# Patient Record
Sex: Male | Born: 1975 | Race: White | Hispanic: No | Marital: Married | Smoking: Never smoker
Health system: Southern US, Community
[De-identification: ages and names within clinical notes are randomized; demographics above are authoritative.]

## PROBLEM LIST (undated history)

## (undated) DIAGNOSIS — D179 Benign lipomatous neoplasm, unspecified: Secondary | ICD-10-CM

## (undated) DIAGNOSIS — E559 Vitamin D deficiency, unspecified: Secondary | ICD-10-CM

## (undated) DIAGNOSIS — I10 Essential (primary) hypertension: Secondary | ICD-10-CM

## (undated) HISTORY — DX: Essential (primary) hypertension: I10

## (undated) HISTORY — DX: Vitamin D deficiency, unspecified: E55.9

---

## 2014-12-30 ENCOUNTER — Ambulatory Visit
Admission: EM | Admit: 2014-12-30 | Discharge: 2014-12-30 | Disposition: A | Discharge status: Home / Self Care | Payer: Commercial Managed Care - HMO | Attending: Family Medicine | Admitting: Family Medicine

## 2014-12-30 DIAGNOSIS — R51 Headache: Secondary | ICD-10-CM | POA: Diagnosis not present

## 2014-12-30 DIAGNOSIS — J069 Acute upper respiratory infection, unspecified: Secondary | ICD-10-CM | POA: Diagnosis not present

## 2014-12-30 DIAGNOSIS — R030 Elevated blood-pressure reading, without diagnosis of hypertension: Secondary | ICD-10-CM | POA: Diagnosis not present

## 2014-12-30 DIAGNOSIS — E669 Obesity, unspecified: Secondary | ICD-10-CM | POA: Diagnosis not present

## 2014-12-30 DIAGNOSIS — IMO0001 Reserved for inherently not codable concepts without codable children: Secondary | ICD-10-CM

## 2014-12-30 DIAGNOSIS — R519 Headache, unspecified: Secondary | ICD-10-CM

## 2014-12-30 LAB — BASIC METABOLIC PANEL
ANION GAP: 8 (ref 5–15)
BUN: 12 mg/dL (ref 6–20)
CALCIUM: 9.3 mg/dL (ref 8.9–10.3)
CO2: 30 mmol/L (ref 22–32)
Chloride: 96 mmol/L — ABNORMAL LOW (ref 101–111)
Creatinine, Ser: 1.15 mg/dL (ref 0.61–1.24)
GFR calc Af Amer: >60 mL/min (ref >60)
GLUCOSE: 90 mg/dL (ref 65–99)
POTASSIUM: 4.7 mmol/L (ref 3.5–5.1)
SODIUM: 134 mmol/L — AB (ref 135–145)

## 2014-12-30 LAB — GLUCOSE, CAPILLARY: GLUCOSE-CAPILLARY: 100 mg/dL — AB (ref 65–99)

## 2014-12-30 MED ORDER — LISINOPRIL 20 MG PO TABS: 10.0000 mg | ORAL_TABLET | Freq: Every day | ORAL | Status: DC

## 2014-12-30 MED ORDER — CLONIDINE HCL 0.1 MG PO TABS
0.1000 mg | ORAL_TABLET | Freq: Once | ORAL | Status: AC
  Administered 2014-12-30: 0.1 mg via ORAL

## 2014-12-30 NOTE — Discharge Instructions (Signed)
Obesity Obesity is defined as having too much total body fat and a body mass index (BMI) of 30 or more. BMI is an estimate of body fat and is calculated from your height and weight. Obesity happens when you consume more calories than you can burn by exercising or performing daily physical tasks. Prolonged obesity can cause major illnesses or emergencies, such as:   Stroke.  Heart disease.  Diabetes.  Cancer.  Arthritis.  High blood pressure (hypertension).  High cholesterol.  Sleep apnea.  Erectile dysfunction.  Infertility problems. CAUSES   Regularly eating unhealthy foods.  Physical inactivity.  Certain disorders, such as an underactive thyroid (hypothyroidism), Cushing's syndrome, and polycystic ovarian syndrome.  Certain medicines, such as steroids, some depression medicines, and antipsychotics.  Genetics.  Lack of sleep. DIAGNOSIS  A health care provider can diagnose obesity after calculating your BMI. Obesity will be diagnosed if your BMI is 30 or higher.  There are other methods of measuring obesity levels. Some other methods include measuring your skinfold thickness, your waist circumference, and comparing your hip circumference to your waist circumference. TREATMENT  A healthy treatment program includes some or all of the following:  Long-term dietary changes.  Exercise and physical activity.  Behavioral and lifestyle changes.  Medicine only under the supervision of your health care provider. Medicines may help, but only if they are used with diet and exercise programs. An unhealthy treatment program includes:  Fasting.  Fad diets.  Supplements and drugs. These choices do not succeed in long-term weight control.  HOME CARE INSTRUCTIONS   Exercise and perform physical activity as directed by your health care provider. To increase physical activity, try the following:  Use stairs instead of elevators.  Park farther away from store  entrances.  Garden, bike, or walk instead of watching television or using the computer.  Eat healthy, low-calorie foods and drinks on a regular basis. Eat more fruits and vegetables. Use low-calorie cookbooks or take healthy cooking classes.  Limit fast food, sweets, and processed snack foods.  Eat smaller portions.  Keep a daily journal of everything you eat. There are many free websites to help you with this. It may be helpful to measure your foods so you can determine if you are eating the correct portion sizes.  Avoid drinking alcohol. Drink more water and drinks without calories.  Take vitamins and supplements only as recommended by your health care provider.  Weight-loss support groups, Tax adviser, counselors, and stress reduction education can also be very helpful. SEEK IMMEDIATE MEDICAL CARE IF:  You have chest pain or tightness.  You have trouble breathing or feel short of breath.  You have weakness or leg numbness.  You feel confused or have trouble talking.  You have sudden changes in your vision. MAKE SURE YOU:  Understand these instructions.  Will watch your condition.  Will get help right away if you are not doing well or get worse. Document Released: 06/03/2004 Document Revised: 09/10/2013 Document Reviewed: 06/02/2011 [facility] Patient Information 2015 [place], [place]. This information is not intended to replace advice given to you by your health care provider. Make sure you discuss any questions you have with your health care provider. Otitis Media With Effusion Otitis media with effusion is the presence of fluid in the middle ear. This is a common problem in children, which often follows ear infections. It may be present for weeks or longer after the infection. Unlike an acute ear infection, otitis media with effusion refers only to fluid  behind the ear drum and not infection. Children with repeated ear and sinus infections and allergy problems are  the most likely to get otitis media with effusion. CAUSES  The most frequent cause of the fluid buildup is dysfunction of the eustachian tubes. These are the tubes that drain fluid in the ears to the back of the nose (nasopharynx). SYMPTOMS   The main symptom of this condition is hearing loss. As a result, you or your child may:  Listen to the TV at a loud volume.  Not respond to questions.  Ask "what" often when spoken to.  Mistake or confuse one sound or word for another.  There may be a sensation of fullness or pressure but usually not pain. DIAGNOSIS   Your health care provider will diagnose this condition by examining you or your child's ears.  Your health care provider may test the pressure in you or your child's ear with a tympanometer.  A hearing test may be conducted if the problem persists. TREATMENT   Treatment depends on the duration and the effects of the effusion.  Antibiotics, decongestants, nose drops, and cortisone-type drugs (tablets or nasal spray) may not be helpful.  Children with persistent ear effusions may have delayed language or behavioral problems. Children at risk for developmental delays in hearing, learning, and speech may require referral to a specialist earlier than children not at risk.  You or your child's health care provider may suggest a referral to an ear, nose, and throat surgeon for treatment. The following may help restore normal hearing:  Drainage of fluid.  Placement of ear tubes (tympanostomy tubes).  Removal of adenoids (adenoidectomy). HOME CARE INSTRUCTIONS   Avoid secondhand smoke.  Infants who are breastfed are less likely to have this condition.  Avoid feeding infants while they are lying flat.  Avoid known environmental allergens.  Avoid people who are sick. SEEK MEDICAL CARE IF:   Hearing is not better in 3 months.  Hearing is worse.  Ear pain.  Drainage from the ear.  Dizziness. MAKE SURE YOU:    Understand these instructions.  Will watch your condition.  Will get help right away if you are not doing well or get worse. Document Released: 06/03/2004 Document Revised: 09/10/2013 Document Reviewed: 11/21/2012 [facility] Patient Information 2015 [place], [place]. This information is not intended to replace advice given to you by your health care provider. Make sure you discuss any questions you have with your health care provider. DASH Eating Plan DASH stands for "Dietary Approaches to Stop Hypertension." The DASH eating plan is a healthy eating plan that has been shown to reduce high blood pressure (hypertension). Additional health benefits may include reducing the risk of type 2 diabetes mellitus, heart disease, and stroke. The DASH eating plan may also help with weight loss. WHAT DO I NEED TO KNOW ABOUT THE DASH EATING PLAN? For the DASH eating plan, you will follow these general guidelines:  Choose foods with a percent daily value for sodium of less than 5% (as listed on the food label).  Use salt-free seasonings or herbs instead of table salt or sea salt.  Check with your health care provider or pharmacist before using salt substitutes.  Eat lower-sodium products, often labeled as "lower sodium" or "no salt added."  Eat fresh foods.  Eat more vegetables, fruits, and low-fat dairy products.  Choose whole grains. Look for the word "whole" as the first word in the ingredient list.  Choose fish and skinless chicken or [place] more  often than red meat. Limit fish, poultry, and meat to 6 oz (170 g) each day.  Limit sweets, desserts, sugars, and sugary drinks.  Choose heart-healthy fats.  Limit cheese to 1 oz (28 g) per day.  Eat more home-cooked food and less restaurant, buffet, and fast food.  Limit fried foods.  Cook foods using methods other than frying.  Limit canned vegetables. If you do use them, rinse them well to decrease the sodium.  When eating at a restaurant,  ask that your food be prepared with less salt, or no salt if possible. WHAT FOODS CAN I EAT? Seek help from a dietitian for individual calorie needs. Grains Whole grain or whole wheat bread. Brown rice. Whole grain or whole wheat pasta. Quinoa, bulgur, and whole grain cereals. Low-sodium cereals. Corn or whole wheat flour tortillas. Whole grain cornbread. Whole grain crackers. Low-sodium crackers. Vegetables Fresh or frozen vegetables (raw, steamed, roasted, or grilled). Low-sodium or reduced-sodium tomato and vegetable juices. Low-sodium or reduced-sodium tomato sauce and paste. Low-sodium or reduced-sodium canned vegetables.  Fruits All fresh, canned (in natural juice), or frozen fruits. Meat and Other Protein Products Ground beef (85% or leaner), grass-fed beef, or beef trimmed of fat. Skinless chicken or [place]. Ground chicken or [place]. Pork trimmed of fat. All fish and seafood. Eggs. Dried beans, peas, or lentils. Unsalted nuts and seeds. Unsalted canned beans. Dairy Low-fat dairy products, such as skim or 1% milk, 2% or reduced-fat cheeses, low-fat ricotta or cottage cheese, or plain low-fat yogurt. Low-sodium or reduced-sodium cheeses. Fats and Oils Tub margarines without trans fats. Light or reduced-fat mayonnaise and salad dressings (reduced sodium). Avocado. Safflower, olive, or canola oils. Natural peanut or almond butter. Other Unsalted popcorn and pretzels. The items listed above may not be a complete list of recommended foods or beverages. Contact your dietitian for more options. WHAT FOODS ARE NOT RECOMMENDED? Grains White bread. White pasta. White rice. Refined cornbread. Bagels and croissants. Crackers that contain trans fat. Vegetables Creamed or fried vegetables. Vegetables in a cheese sauce. Regular canned vegetables. Regular canned tomato sauce and paste. Regular tomato and vegetable juices. Fruits Dried fruits. Canned fruit in light or heavy syrup. Fruit juice. Meat  and Other Protein Products Fatty cuts of meat. Ribs, chicken wings, bacon, sausage, bologna, salami, chitterlings, fatback, hot dogs, bratwurst, and packaged luncheon meats. Salted nuts and seeds. Canned beans with salt. Dairy Whole or 2% milk, cream, half-and-half, and cream cheese. Whole-fat or sweetened yogurt. Full-fat cheeses or blue cheese. Nondairy creamers and whipped toppings. Processed cheese, cheese spreads, or cheese curds. Condiments Onion and garlic salt, seasoned salt, table salt, and sea salt. Canned and packaged gravies. Worcestershire sauce. Tartar sauce. Barbecue sauce. Teriyaki sauce. Soy sauce, including reduced sodium. Steak sauce. Fish sauce. Oyster sauce. Cocktail sauce. Horseradish. Ketchup and mustard. Meat flavorings and tenderizers. Bouillon cubes. Hot sauce. Tabasco sauce. Marinades. Taco seasonings. Relishes. Fats and Oils Butter, stick margarine, lard, shortening, ghee, and bacon fat. Coconut, palm kernel, or palm oils. Regular salad dressings. Other Pickles and olives. Salted popcorn and pretzels. The items listed above may not be a complete list of foods and beverages to avoid. Contact your dietitian for more information. WHERE CAN I FIND MORE INFORMATION? National Heart, Lung, and [employer] Institute: travelstabloid.com Document Released: 04/15/2011 Document Revised: 09/10/2013 Document Reviewed: 02/28/2013 [facility] Patient Information 2015 [place], [place]. This information is not intended to replace advice given to you by your health care provider. Make sure you discuss any questions you have with your  health care provider. Hypertension Hypertension, commonly called high blood pressure, is when the force of blood pumping through your arteries is too strong. Your arteries are the blood vessels that carry blood from your heart throughout your body. A blood pressure reading consists of a higher number over a lower number, such as 110/72.  The higher number (systolic) is the pressure inside your arteries when your heart pumps. The lower number (diastolic) is the pressure inside your arteries when your heart relaxes. Ideally you want your blood pressure below 120/80. Hypertension forces your heart to work harder to pump blood. Your arteries may become narrow or stiff. Having hypertension puts you at risk for heart disease, stroke, and other problems.  RISK FACTORS Some risk factors for high blood pressure are controllable. Others are not.  Risk factors you cannot control include:   Race. You may be at higher risk if you are African American.  Age. Risk increases with age.  Gender. Men are at higher risk than women before age 41 years. After age 35, women are at higher risk than men. Risk factors you can control include:  Not getting enough exercise or physical activity.  Being overweight.  Getting too much fat, sugar, calories, or salt in your diet.  Drinking too much alcohol. SIGNS AND SYMPTOMS Hypertension does not usually cause signs or symptoms. Extremely high blood pressure (hypertensive crisis) may cause headache, anxiety, shortness of breath, and nosebleed. DIAGNOSIS  To check if you have hypertension, your health care provider will measure your blood pressure while you are seated, with your arm held at the level of your heart. It should be measured at least twice using the same arm. Certain conditions can cause a difference in blood pressure between your right and left arms. A blood pressure reading that is higher than normal on one occasion does not mean that you need treatment. If one blood pressure reading is high, ask your health care provider about having it checked again. TREATMENT  Treating high blood pressure includes making lifestyle changes and possibly taking medicine. Living a healthy lifestyle can help lower high blood pressure. You may need to change some of your habits. Lifestyle changes may  include:  Following the DASH diet. This diet is high in fruits, vegetables, and whole grains. It is low in salt, red meat, and added sugars.  Getting at least 2 hours of brisk physical activity every week.  Losing weight if necessary.  Not smoking.  Limiting alcoholic beverages.  Learning ways to reduce stress. If lifestyle changes are not enough to get your blood pressure under control, your health care provider may prescribe medicine. You may need to take more than one. Work closely with your health care provider to understand the risks and benefits. HOME CARE INSTRUCTIONS  Have your blood pressure rechecked as directed by your health care provider.   Take medicines only as directed by your health care provider. Follow the directions carefully. Blood pressure medicines must be taken as prescribed. The medicine does not work as well when you skip doses. Skipping doses also puts you at risk for problems.   Do not smoke.   Monitor your blood pressure at home as directed by your health care provider. SEEK MEDICAL CARE IF:   You think you are having a reaction to medicines taken.  You have recurrent headaches or feel dizzy.  You have swelling in your ankles.  You have trouble with your vision. SEEK IMMEDIATE MEDICAL CARE IF:  You develop a  severe headache or confusion.  You have unusual weakness, numbness, or feel faint.  You have severe chest or abdominal pain.  You vomit repeatedly.  You have trouble breathing. MAKE SURE YOU:   Understand these instructions.  Will watch your condition.  Will get help right away if you are not doing well or get worse. Document Released: 04/26/2005 Document Revised: 09/10/2013 Document Reviewed: 02/16/2013 [facility] Patient Information 2015 [place], [place]. This information is not intended to replace advice given to you by your health care provider. Make sure you discuss any questions you have with your health care provider.

## 2014-12-30 NOTE — ED Notes (Signed)
C/o headache x 1 week. Back of head and occasionally radiates to front right temple. Denies blurred vision. "when I close my eyes sometimes it feels better"

## 2014-12-30 NOTE — ED Provider Notes (Signed)
CSN: [id number]     Arrival date & time 12/30/14  0300 History   First MD Initiated Contact with Patient 12/30/14 [phone]     Chief Complaint  Patient presents with  . 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  Does not have PCM this is his first visit in 6 years. Saw optometry this year new Rx for contacts and glasses one eye nearsighted, the other farsighted worsened 0.5 each patient. Denied visual changes or loss since headache began.  Has been gaining weight typically 170 lbs now 230lbs lost 8 lbs in past 2 months working with a trainer exercising but not doing well with his diet.  Eating the same thing as his teenage kids.  Added brocolli and spinach last week and had diarrhea daily increased gas.  Has been awakening 0400 with headache the past week taking motrin 800mg  po then falling back asleep until usual wake up 0530 for work.  Typically goes to bed 22-2300 nightly  Has been taking 2 excedrin tabs po at bedtime.  Denied recent illness or exposure to sick family members/coworkers.   Patient unsure of family history has never known father or extended relatives and mother died MVA age 15, maternal uncle with hypertension Denied seasonal allergies, hypertension, diabetes, vision problems.  Patient is a 39 y.o. male presenting with headaches. The history is provided by the patient.  Headache Pain location:  Occipital and frontal Quality:  Sharp Radiates to:  Does not radiate Severity currently:  5/10 Onset quality:  Sudden Duration:  1 week Timing:  Constant Progression:  Waxing and waning Chronicity:  New Similar to prior headaches: no   Context: activity   Context: not exposure to bright light, not caffeine, not coughing, not defecating, not eating, not stress, not exposure to cold air, not  intercourse, not loud noise and not straining   Relieved by:  NSAIDs and aspirin Worsened by:  Activity Ineffective treatments:  Aspirin and NSAIDs Associated symptoms: diarrhea   Associated symptoms: no abdominal pain, no back pain, no blurred vision, no congestion, no cough, no dizziness, no drainage, no ear pain, no eye pain, no facial pain, no fatigue, no fever, no focal weakness, no hearing loss, no loss of balance, no myalgias, no nausea, no near-syncope, no neck pain, no neck stiffness, no numbness, no paresthesias, no photophobia, no seizures, no sinus pressure, no sore throat, no swollen glands, no syncope, no tingling, no URI, no visual change, no vomiting and no weakness   Diarrhea:    Number of occurrences:  1-2 times per day   Severity:  Mild   Duration:  1 week   Timing:  Intermittent   Progression:  Unchanged Risk factors: sedentary lifestyle   Risk factors: no anger, no family hx of SAH and does not have insomnia     History reviewed. No pertinent past medical history. History reviewed. No pertinent past surgical history. History reviewed. No pertinent family history. Social History  Substance Use Topics  . Smoking status: Never Smoker   . Smokeless tobacco: None  . Alcohol Use: No    Review of Systems  Constitutional: Negative for fever, chills, diaphoresis, activity change, appetite change and fatigue.  HENT: Negative for congestion, dental problem, drooling, ear discharge, ear pain, facial swelling, hearing loss, mouth sores, nosebleeds, postnasal drip, rhinorrhea, sinus pressure, sneezing, sore throat, tinnitus, trouble swallowing and voice  change.   Eyes: Negative for blurred vision, photophobia, pain, discharge, redness, itching and visual disturbance.  Respiratory: Negative for cough, choking, chest tightness, shortness of breath, wheezing and stridor.   Cardiovascular: Negative for chest pain, palpitations, leg swelling, syncope and near-syncope.   Gastrointestinal: Positive for diarrhea. Negative for nausea, vomiting, abdominal pain, constipation, blood in stool, abdominal distention, anal bleeding and rectal pain.  Endocrine: Negative for cold intolerance and heat intolerance.  Genitourinary: Negative for dysuria, frequency and hematuria.  Musculoskeletal: Negative for myalgias, back pain, joint swelling, arthralgias, gait problem, neck pain and neck stiffness.  Skin: Negative for color change, pallor, rash and wound.  Allergic/Immunologic: Negative for environmental allergies, food allergies and immunocompromised state.  Neurological: Positive for headaches. Negative for dizziness, tremors, focal weakness, seizures, syncope, facial asymmetry, speech difficulty, weakness, light-headedness, numbness, paresthesias and loss of balance.  Hematological: Negative for adenopathy. Does not bruise/bleed easily.  Psychiatric/Behavioral: Positive for sleep disturbance. Negative for behavioral problems, confusion and agitation.    Allergies  Review of patient's allergies indicates no known allergies.  Home Medications   Prior to Admission medications   Not on File   BP 158/105 mmHg  Pulse 73  Temp(Src) 98 F (36.7 C) (Oral)  Resp 17  Ht 5\' 8"  (1.727 m)  Wt 238 lb (107.956 kg)  BMI 36.20 kg/m2  SpO2 100% Physical Exam  Constitutional: He is oriented to person, place, and time. Vital signs are normal. He appears well-developed and well-nourished. No distress.  HENT:  Head: Normocephalic and atraumatic.  Right Ear: Hearing, external ear and ear canal normal. Tympanic membrane is scarred. A middle ear effusion is present.  Left Ear: Hearing, external ear and ear canal normal. Tympanic membrane is scarred. A middle ear effusion is present.  Nose: Nose normal. No mucosal edema, rhinorrhea or sinus tenderness. Right sinus exhibits no maxillary sinus tenderness and no frontal sinus tenderness. Left sinus exhibits no maxillary sinus tenderness  and no frontal sinus tenderness.  Mouth/Throat: Uvula is midline, oropharynx is clear and moist and mucous membranes are normal. He does not have dentures. No oral lesions. No trismus in the jaw. Normal dentition. No dental abscesses, uvula swelling, lacerations or dental caries. No oropharyngeal exudate, posterior oropharyngeal edema, posterior oropharyngeal erythema or tonsillar abscesses.  Bilateral TMs with clear fluid and scar bilateral TMs 7 oclock position  Eyes: Conjunctivae, EOM and lids are normal. Pupils are equal, round, and reactive to light. Right eye exhibits no chemosis, no discharge, no exudate and no hordeolum. No foreign body present in the right eye. Left eye exhibits no chemosis, no discharge, no exudate and no hordeolum. No foreign body present in the left eye. Right conjunctiva is not injected. Right conjunctiva has no hemorrhage. Left conjunctiva is not injected. Left conjunctiva has no hemorrhage. No scleral icterus. Right eye exhibits normal extraocular motion and no nystagmus. Left eye exhibits normal extraocular motion and no nystagmus. Right pupil is round and reactive. Left pupil is round and reactive. Pupils are equal.  Neck: Trachea normal and normal range of motion. Neck supple. No JVD present. No tracheal deviation present. No thyromegaly present.  Cardiovascular: Normal rate, regular rhythm, S1 normal, S2 normal, normal heart sounds and intact distal pulses.  PMI is not displaced.  Exam reveals no gallop and no friction rub.   No murmur heard. Pulmonary/Chest: Effort normal and breath sounds normal. No accessory muscle usage or stridor. No respiratory distress. He has no decreased breath sounds. He has no wheezes. He has no  rhonchi. He has no rales. He exhibits no tenderness.  Abdominal: Soft. He exhibits no shifting dullness, no distension, no pulsatile liver, no fluid wave, no abdominal bruit, no ascites, no pulsatile midline mass and no mass. Bowel sounds are decreased.  There is no hepatosplenomegaly. There is no tenderness. There is no rigidity, no rebound, no guarding, no CVA tenderness, no tenderness at McBurney's point and negative Murphy's sign. Hernia confirmed negative in the ventral area.  Dull to percussion x 4 quads  Musculoskeletal: Normal range of motion. He exhibits no edema or tenderness.       Right shoulder: Normal.       Left shoulder: Normal.       Right hip: Normal.       Right knee: Normal.       Left knee: Normal.       Right ankle: Normal.       Left ankle: Normal.       Cervical back: Normal.       Lumbar back: Normal.       Right hand: Normal.       Left hand: Normal.  Lymphadenopathy:    He has no cervical adenopathy.  Neurological: He is alert and oriented to person, place, and time. He has normal reflexes. He displays normal reflexes. No cranial nerve deficit. He exhibits normal muscle tone. Coordination normal.  Skin: Skin is warm, dry and intact. No abrasion, no bruising, no burn, no ecchymosis, no laceration, no lesion, no petechiae and no rash noted. He is not diaphoretic. No cyanosis or erythema. No pallor. Nails show no clubbing.  Psychiatric: He has a normal mood and affect. His speech is normal and behavior is normal. Judgment and thought content normal. Cognition and memory are normal.  Nursing note and vitals reviewed.   ED Course  Procedures (including critical care time) Labs Review Labs Reviewed  GLUCOSE, CAPILLARY - Abnormal; Notable for the following:    Glucose-Capillary 100 (*)    All other components within normal limits  BASIC METABOLIC PANEL - Abnormal; Notable for the following:    Sodium 134 (*)    Chloride 96 (*)    All other components within normal limits  CBG MONITORING, ED  CBG MONITORING, ED    Imaging Review No results found. Filed Vitals:   12/30/14 1150  BP: 128/87  Pulse:   Temp:   Resp:    1058 blood pressure repeat 159/108 heartrate 59 repeat 156/105.   Headache decreased from  5/10 but not resolved 3/10.  Patient to start lisinopril 10mg  today and follow up in 1 week with blood pressure log with [place] or PCM if blood pressure still greater than 140/90 will increase to 20mg  po daily.  Labs normal kidney function today copies given to patient to show new PCM.  Patient verbalized understanding of information/instructions, agreed with plan of care and had no further questions at this time.  Medications  cloNIDine (CATAPRES) tablet 0.1 mg (0.1 mg Oral Given 12/30/14 1030)  cloNIDine (CATAPRES) tablet 0.1 mg (0.1 mg Oral Given 12/30/14 1114)  by RN Marni Griffon  Patient improved after second dose clonidine 0.1mg  po headache decreased and BP normalized.  Discharged to home and given Dr Chinita Greenland card consider as new PCM as has same day availability for appts at this time.  Patient to follow up next week with his BP log to see if dosage needs to be bumped up to 20mg  po daily.  Patient given work excuse note  for today.  Patient verbalized understanding of information/instructions and had no further questions at this time. MDM   1. Elevated blood pressure   2. Obesity   3. Acute intractable headache, unspecified headache type   start lisinopril 10mg  po daily (1/2 tab) at bedtime take first one once filled at pharmacy.  Given clonipin 0.1mg  po x 2 in clinic today.  Patient is going to buy machine and start monitoring blood pressures at home.  Discussed normal blood pressure 140/90 or less.  Discussed weight loss, dash diet.  Exitcare handout on dash diet, obesity, hypertension given to patient today.  Continue working out with Fish farm manager.  Discussed portion control and avoiding processed foods.  Discussed with patient when adding fresh fruits and vegetables/fiber to diet it can cause gas/diarrhea if introduced too quickly.    Continue to monitor blood pressure at home and maintain log of blood pressure and pulse to bring to follow up appointments.  Continue low sodium diet and exercise  program.  Recommended weight loss/weight maintenance to BMI 20-25.  Return to the clinic if any new symptoms.  Patient verbalized agreement and understanding of treatment plan and had no further questions at this time.   P2:  Diet and Exercise specific for HTN  Patient had started working out with trainer 2 months ago 7 lbs weight loss currently 230 usual 170lbs.  Currently BMI 36 Discussed weight loss e.g. keeping dietary log, measuring portions, increasing activity, 1800 calories per day for male target 50 pound weight loss over next 3-12 months.  Exitcare handout on diet and health, dash diet given to patient.  1-2 pounds weight loss per week.  Patient agreed with plan of care and had no further questions at this time and verbalized understanding of instructions/information.  Discussed with patient probably related to elevated blood pressure but local community has also had increase in viral illness seen recently along with allergies.  For acute pain, rest, and intermittent application of heat, analgesics, and PRN po NSAIDS.  Avoid known triggers e.g. sleep deprivation, foods, stress, dehydration.  If headache is the worst headache of entire life and came on like a clap of thunder patient was instructed to go to the Emergency Room.  Call or return to clinic as needed if these symptoms worsen or fail to improve as anticipated.  Exitcare handout on headaches given to patient and patient also instructed to maintain headache log.  Patient verbalized agreement and understanding of treatment plan. P2:  Diet and fitness    Olen Cordial, NP 12/30/14 1424

## 2015-01-02 ENCOUNTER — Ambulatory Visit: Payer: Commercial Managed Care - HMO | Admitting: Family Medicine

## 2015-01-03 ENCOUNTER — Ambulatory Visit (INDEPENDENT_AMBULATORY_CARE_PROVIDER_SITE_OTHER): Payer: Commercial Managed Care - HMO | Admitting: Family Medicine

## 2015-01-03 ENCOUNTER — Encounter: Payer: Self-pay | Admitting: Family Medicine

## 2015-01-03 VITALS — BP 124/86 | HR 64 | Ht 68.0 in | Wt 236.8 lb

## 2015-01-03 DIAGNOSIS — N529 Male erectile dysfunction, unspecified: Secondary | ICD-10-CM | POA: Insufficient documentation

## 2015-01-03 DIAGNOSIS — Z825 Family history of asthma and other chronic lower respiratory diseases: Secondary | ICD-10-CM | POA: Diagnosis not present

## 2015-01-03 DIAGNOSIS — R519 Headache, unspecified: Secondary | ICD-10-CM | POA: Insufficient documentation

## 2015-01-03 DIAGNOSIS — E669 Obesity, unspecified: Secondary | ICD-10-CM

## 2015-01-03 DIAGNOSIS — I10 Essential (primary) hypertension: Secondary | ICD-10-CM | POA: Diagnosis not present

## 2015-01-03 DIAGNOSIS — E559 Vitamin D deficiency, unspecified: Secondary | ICD-10-CM | POA: Diagnosis not present

## 2015-01-03 DIAGNOSIS — R51 Headache: Secondary | ICD-10-CM | POA: Diagnosis not present

## 2015-01-03 DIAGNOSIS — E785 Hyperlipidemia, unspecified: Secondary | ICD-10-CM

## 2015-01-03 MED ORDER — THERA VITAL M PO TABS: 1.0000 | ORAL_TABLET | Freq: Every day | ORAL | Status: AC

## 2015-01-03 NOTE — Progress Notes (Signed)
Date:  01/03/2015   Name:  Dennis Bryant   DOB:  27-Mar-1976   MRN:  [id number]  PCP:  Adline Potter, MD    Chief Complaint: Hypertension   History of Present Illness:  This is a 39 y.o. male seen [place] 3d ago with uncontrolled HTN and persistent headache, given clonidine and discharged on lisinopril 10 mg daily which he is tolerating well. HA has resolved. Works with trainer at gym and takes preworkout drink but unsure what is in it. Also takes multivit. Tetanus 2008. 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 Getting erections more difficult, wife thinks has anxiety problem but pt feels it's just his personality, not affecting marriage or work.  Review of Systems:  Review of Systems  Constitutional: Negative for appetite change and unexpected weight change.  HENT: Negative for ear pain and sore throat.   Eyes: Negative for pain.  Respiratory: Negative for shortness of breath.   Cardiovascular: Negative for chest pain and leg swelling.  Gastrointestinal: Negative for abdominal pain, diarrhea and constipation.  Endocrine: Negative for polyuria.  Genitourinary: Negative for difficulty urinating and penile pain.  Musculoskeletal: Negative for back pain and neck pain.  Skin: Negative for rash.  Neurological: Negative for tremors and syncope.  Hematological: Negative for adenopathy.  Psychiatric/Behavioral: Negative for confusion and dysphoric mood.    Patient Active Problem List   Diagnosis Date Noted  . Obesity (BMI 30.0-34.9) 01/03/2015  . Hypertension 01/03/2015  . Headache 01/03/2015  . Erectile dysfunction 01/03/2015  . FH: COPD (chronic obstructive pulmonary disease) 01/03/2015    Prior to Admission medications   Medication Sig Start Date End Date Taking? Authorizing Provider  lisinopril (PRINIVIL,ZESTRIL) 20 MG tablet Take 0.5 tablets (10 mg total) by mouth daily. 12/30/14  Yes Olen Cordial, NP    No Known Allergies  No past surgical history on  file.  Social History  Substance Use Topics  . Smoking status: Never Smoker   . Smokeless tobacco: None  . Alcohol Use: No    No family history on file.  Medication list has been reviewed and updated.  Physical Examination: BP 124/86 mmHg  Pulse 64  Ht 5\' 8"  (1.727 m)  Wt 236 lb 12.8 oz (107.412 kg)  BMI 36.01 kg/m2  Physical Exam  Constitutional: He is oriented to person, place, and time. He appears well-developed and well-nourished.  HENT:  Head: Normocephalic and atraumatic.  Right Ear: External ear normal.  Left Ear: External ear normal.  Eyes: Conjunctivae and EOM are normal. Pupils are equal, round, and reactive to light. No scleral icterus.  Neck: Neck supple. No thyromegaly present.  Cardiovascular: Normal rate, regular rhythm and normal heart sounds.   Pulmonary/Chest: Effort normal and breath sounds normal.  Abdominal: Soft. He exhibits no distension and no mass. There is no tenderness.  Genitourinary: Penis normal.  Testes normal no hernias  Musculoskeletal: Normal range of motion. He exhibits no edema.  Lymphadenopathy:    He has no cervical adenopathy.  Neurological: He is alert and oriented to person, place, and time. Coordination normal.  Skin: Skin is warm and dry. No rash noted.  Psychiatric: He has a normal mood and affect. His behavior is normal.    Assessment and Plan:  1. Obesity (BMI 30.0-34.9) Discussed weight loss - TSH - Lipid Profile - Vitamin D (25 hydroxy)  2. Essential hypertension Well controlled today, continue lisinopril, cut back on caffeine - Comprehensive metabolic panel - CBC  3. Headache, unspecified  headache type Resolved with improved BP control  4. Erectile dysfunction, unspecified erectile dysfunction type Mild - Testosterone  5. FH: COPD (chronic obstructive pulmonary disease)   Return in about 4 weeks (around 01/31/2015).  Satira Anis. [facility]  01/03/2015

## 2015-01-04 LAB — LIPID PANEL
CHOL/HDL RATIO: 5.5 ratio — AB (ref 0.0–5.0)
Cholesterol, Total: 233 mg/dL — ABNORMAL HIGH (ref 100–199)
HDL: 42 mg/dL (ref >39)
LDL Calculated: 150 mg/dL — ABNORMAL HIGH (ref 0–99)
TRIGLYCERIDES: 207 mg/dL — AB (ref 0–149)
VLDL Cholesterol Cal: 41 mg/dL — ABNORMAL HIGH (ref 5–40)

## 2015-01-04 LAB — CBC
HEMATOCRIT: 45.3 % (ref 37.5–51.0)
Hemoglobin: 15.6 g/dL (ref 12.6–17.7)
MCH: 28.4 pg (ref 26.6–33.0)
MCHC: 34.4 g/dL (ref 31.5–35.7)
MCV: 82 fL (ref 79–97)
PLATELETS: 353 10*3/uL (ref 150–379)
RBC: 5.5 x10E6/uL (ref 4.14–5.80)
RDW: 13.9 % (ref 12.3–15.4)
WBC: 6.6 10*3/uL (ref 3.4–10.8)

## 2015-01-04 LAB — COMPREHENSIVE METABOLIC PANEL
A/G RATIO: 1.6 (ref 1.1–2.5)
ALBUMIN: 4.5 g/dL (ref 3.5–5.5)
ALK PHOS: 90 IU/L (ref 39–117)
ALT: 50 IU/L — ABNORMAL HIGH (ref 0–44)
AST: 33 IU/L (ref 0–40)
BILIRUBIN TOTAL: 0.8 mg/dL (ref 0.0–1.2)
BUN / CREAT RATIO: 12 (ref 9–20)
BUN: 13 mg/dL (ref 6–24)
CHLORIDE: 95 mmol/L — AB (ref 97–108)
CO2: 24 mmol/L (ref 18–29)
Calcium: 9.5 mg/dL (ref 8.7–10.2)
Creatinine, Ser: 1.06 mg/dL (ref 0.76–1.27)
GFR calc non Af Amer: 87 mL/min/{1.73_m2} (ref >59)
GFR, EST AFRICAN AMERICAN: 101 mL/min/{1.73_m2} (ref >59)
GLOBULIN, TOTAL: 2.8 g/dL (ref 1.5–4.5)
GLUCOSE: 72 mg/dL (ref 65–99)
Potassium: 4.7 mmol/L (ref 3.5–5.2)
SODIUM: 138 mmol/L (ref 134–144)
TOTAL PROTEIN: 7.3 g/dL (ref 6.0–8.5)

## 2015-01-04 LAB — VITAMIN D 25 HYDROXY (VIT D DEFICIENCY, FRACTURES): VIT D 25 HYDROXY: 28.9 ng/mL — AB (ref 30.0–100.0)

## 2015-01-04 LAB — TESTOSTERONE: TESTOSTERONE: 557 ng/dL (ref 348–1197)

## 2015-01-04 LAB — TSH: TSH: 1.83 u[IU]/mL (ref 0.450–4.500)

## 2015-01-06 ENCOUNTER — Other Ambulatory Visit: Payer: Self-pay

## 2015-01-06 MED ORDER — LISINOPRIL 20 MG PO TABS: 20.0000 mg | ORAL_TABLET | Freq: Every day | ORAL | Status: DC

## 2015-01-14 MED ORDER — VITAMIN D 50 MCG (2000 UT) PO CAPS: 1.0000 | ORAL_CAPSULE | Freq: Every day | ORAL | Status: DC

## 2015-01-14 NOTE — Addendum Note (Signed)
Addended by: Adline Potter on: 01/14/2015 09:34 AM   Modules accepted: Orders, SmartSet

## 2015-01-21 ENCOUNTER — Encounter: Payer: Self-pay | Admitting: Family Medicine

## 2015-01-21 ENCOUNTER — Ambulatory Visit (INDEPENDENT_AMBULATORY_CARE_PROVIDER_SITE_OTHER): Payer: Commercial Managed Care - HMO | Admitting: Family Medicine

## 2015-01-21 VITALS — BP 128/100 | HR 72 | Ht 68.0 in | Wt 238.6 lb

## 2015-01-21 DIAGNOSIS — E559 Vitamin D deficiency, unspecified: Secondary | ICD-10-CM

## 2015-01-21 DIAGNOSIS — I10 Essential (primary) hypertension: Secondary | ICD-10-CM | POA: Diagnosis not present

## 2015-01-21 DIAGNOSIS — E669 Obesity, unspecified: Secondary | ICD-10-CM

## 2015-01-21 DIAGNOSIS — E785 Hyperlipidemia, unspecified: Secondary | ICD-10-CM

## 2015-01-21 DIAGNOSIS — E66811 Obesity, class 1: Secondary | ICD-10-CM

## 2015-01-21 MED ORDER — LISINOPRIL 20 MG PO TABS: 20.0000 mg | ORAL_TABLET | Freq: Every day | ORAL | Status: DC

## 2015-01-21 NOTE — Progress Notes (Signed)
Date:  01/21/2015   Name:  Dennis Bryant   DOB:  1975-12-29   MRN:  [id number]  PCP:  Adline Potter, MD    Chief Complaint: Hypertension   History of Present Illness:  This is a 39 y.o. male for f/u obesity, HTN, HLD, vit D deficiency. Taking lisinopril but needs refill, home BP's run from 133/91 to 147/103, no further headaches. Has gained 2# since last visit despite working with trainer 4d/wk, amenable to seeing nutritionist. Blood work last visit showed HLD and low vit D, started vitamin D supplement yesterday. Declines flu imm.  Review of Systems:  Review of Systems  Respiratory: Negative for shortness of breath.   Cardiovascular: Negative for chest pain and leg swelling.    Patient Active Problem List   Diagnosis Date Noted  . Hyperlipidemia 01/21/2015  . Vitamin D deficiency 01/21/2015  . Obesity (BMI 30.0-34.9) 01/03/2015  . Hypertension 01/03/2015  . Headache 01/03/2015  . Erectile dysfunction 01/03/2015  . FH: COPD (chronic obstructive pulmonary disease) 01/03/2015    Prior to Admission medications   Medication Sig Start Date End Date Taking? Authorizing Provider  Cholecalciferol (VITAMIN D) 2000 UNITS CAPS Take 1 capsule (2,000 Units total) by mouth daily. 01/14/15  Yes Adline Potter, MD  lisinopril (PRINIVIL,ZESTRIL) 20 MG tablet Take 1 tablet (20 mg total) by mouth daily. 01/21/15  Yes Adline Potter, MD  Multiple Vitamins-Minerals (MULTIVITAMIN) tablet Take 1 tablet by mouth daily. 01/03/15  Yes Adline Potter, MD    No Known Allergies  No past surgical history on file.  Social History  Substance Use Topics  . Smoking status: Never Smoker   . Smokeless tobacco: None  . Alcohol Use: No    No family history on file.  Medication list has been reviewed and updated.  Physical Examination: BP 128/100 mmHg  Pulse 72  Ht 5\' 8"  (1.727 m)  Wt 238 lb 9.6 oz (108.228 kg)  BMI 36.29 kg/m2  Physical Exam  Constitutional: He appears well-developed and  well-nourished.  Cardiovascular: Normal rate, regular rhythm and normal heart sounds.   Pulmonary/Chest: Effort normal and breath sounds normal.  Musculoskeletal: He exhibits no edema.  Neurological: He is alert.  Skin: Skin is warm and dry.  Psychiatric: He has a normal mood and affect. His behavior is normal.    Assessment and Plan:  1. Essential hypertension Marginal control, refill lisinopril, consider adding diuretic if unimproved with weight loss next visit  2. Obesity (BMI 30.0-34.9) Unable to loose weight despite regular exercise - Amb ref to Medical Nutrition Therapy-MNT  3. Hyperlipidemia Discussed rx options, should improve with weight loss  4. Vitamin D deficiency Cont supplementation, recheck level next visit   Return in about 3 months (around 04/22/2015).  Satira Anis. [facility]  01/21/2015

## 2015-02-05 ENCOUNTER — Ambulatory Visit: Payer: Commercial Managed Care - HMO | Admitting: Family Medicine

## 2015-02-10 ENCOUNTER — Encounter: Payer: Commercial Managed Care - HMO | Attending: Family Medicine | Admitting: Dietician

## 2015-02-10 VITALS — Ht 68.0 in | Wt 236.7 lb

## 2015-02-10 DIAGNOSIS — I10 Essential (primary) hypertension: Secondary | ICD-10-CM | POA: Diagnosis not present

## 2015-02-10 NOTE — Patient Instructions (Signed)
   Keep portions of starches to 1 cup or less with meals.   Continue with salads, or grilled lean meats and vegetables when eating out.  Check online for restaurant nutrition info: try [email or web address].   Follow DASH diet guidelines for 1800-2000 calories daily.   Can try tracking food intake with phone app such as MyFitnessPal

## 2015-02-10 NOTE — Progress Notes (Signed)
Medical Nutrition Therapy: Visit start time: 9021  end time: 1145 Assessment:  Diagnosis: Hypertension Past medical history: no significant history, just recently developed HTN Psychosocial issues/ stress concerns: patient rpeorts moderate stress level, feels stress regarding diagnosis Preferred learning method:  . Visual   Current weight: 236.7lbs  Height: 5'8" Medications, supplements: reviewed list in chart with patient Progress and evaluation: Patient reports decreasing carbohydrate intake recently to lose weight.          Received 1400kcal meal plan from his personal trainer advising 40% carb, 40% protein, and 20% fat. He is uncertain about trying that plan.           He has been decreasing sodium intake, using Mrs Deliah Boston to substitute  Physical activity: heavy weight lifting 60 minutes, 4 times per week  Dietary Intake:  Usual eating pattern includes 3 meals and 1 snacks per day. Dining out frequency: 8 meals per week.  Breakfast: leftovers from dinner tacos, pizza, occasionally ham and egg biscuit Snack: none Lunch: usually out, often grilled chicken salad Snack: none Supper: family meal, sometimes sandwich Snack: pretzels, other crunchy snack Beverages: mostly water, 1gal daily, sugar free rock star energy drink (10am), diet coke  Nutrition Care Education: Topics covered: Hypertension, weight management Basic nutrition: basic food groups, appropriate nutrient balance, appropriate meal and snack schedule, general nutrition guidelines    Weight control: benefits of weight control, determining reasonable weight goal, 1900kcal meal plan for weight loss, 40% carbohydrate, 30% protein, 30% fat.  Hypertension: identifying high sodium foods and goal for 2000mg  sodium daily or less, identifying food sources of Calcium, potassium, magnesium; DASH diet guidelines  Nutritional Diagnosis:  NI-5.5 Imbalance of nutrients As related to low carb, high protein intake.  As evidenced by patient  report.  Intervention: Instruction as noted above.    Encouraged patient to follow DASH diet guidelines, to choose healthy carbohydrate foods in controlled portions.   Also encouraged adequate protein and some healthy fats.   Education Materials given:  . General diet guidelines for Hypertension . Food lists/ Planning A Balanced Meal . Sample meal pattern/ menus . Goals/ instructions   Learner/ who was taught:  . Patient   Level of understanding: Marland Kitchen Verbalizes/ demonstrates competency .  Demonstrated degree of understanding via:   Teach back Learning barriers: . None  Willingness to learn/ readiness for change: . Eager, change in progress  Monitoring and Evaluation:  Dietary intake, exercise, blood pressure control, and body weight      follow up: 03/17/15

## 2015-03-17 ENCOUNTER — Ambulatory Visit: Payer: Commercial Managed Care - HMO | Admitting: Dietician

## 2015-04-16 ENCOUNTER — Ambulatory Visit (INDEPENDENT_AMBULATORY_CARE_PROVIDER_SITE_OTHER): Payer: Commercial Managed Care - HMO | Admitting: Family Medicine

## 2015-04-16 ENCOUNTER — Encounter: Payer: Self-pay | Admitting: Family Medicine

## 2015-04-16 VITALS — BP 128/82 | HR 80 | Ht 68.0 in | Wt 241.0 lb

## 2015-04-16 DIAGNOSIS — E559 Vitamin D deficiency, unspecified: Secondary | ICD-10-CM | POA: Diagnosis not present

## 2015-04-16 DIAGNOSIS — M25561 Pain in right knee: Secondary | ICD-10-CM

## 2015-04-16 DIAGNOSIS — E669 Obesity, unspecified: Secondary | ICD-10-CM | POA: Diagnosis not present

## 2015-04-16 DIAGNOSIS — G8929 Other chronic pain: Secondary | ICD-10-CM | POA: Diagnosis not present

## 2015-04-16 DIAGNOSIS — I10 Essential (primary) hypertension: Secondary | ICD-10-CM | POA: Diagnosis not present

## 2015-04-16 MED ORDER — LISINOPRIL 20 MG PO TABS: 20.0000 mg | ORAL_TABLET | Freq: Every day | ORAL | Status: DC

## 2015-04-16 NOTE — Progress Notes (Signed)
Date:  04/16/2015   Name:  Dennis Bryant   DOB:  07/31/1975   MRN:  [id number]  PCP:  Adline Potter, MD    Chief Complaint: Hypertension   History of Present Illness:  This is a 39 y.o. male seen in f/u for HTN, vit D def, and obesity. Saw MNT since last visit and lost weight but regained most of it back. Has been taking vit D supplement daily. Reports R knee pain during day since July, occ radiates down leg to foot, occ R ankle edema, improves overnight.  Review of Systems:  Review of Systems  Constitutional: Negative for fever.  Respiratory: Negative for shortness of breath.   Cardiovascular: Negative for chest pain and leg swelling.  Endocrine: Negative for polyuria.  Genitourinary: Negative for difficulty urinating.  Neurological: Negative for syncope and light-headedness.    Patient Active Problem List   Diagnosis Date Noted  . Hyperlipidemia 01/21/2015  . Vitamin D deficiency 01/21/2015  . Obesity (BMI 30.0-34.9) 01/03/2015  . Hypertension 01/03/2015  . Headache 01/03/2015  . Erectile dysfunction 01/03/2015  . FH: COPD (chronic obstructive pulmonary disease) 01/03/2015    Prior to Admission medications   Medication Sig Start Date End Date Taking? Authorizing Provider  Cholecalciferol (VITAMIN D) 2000 UNITS CAPS Take 1 capsule (2,000 Units total) by mouth daily. 01/14/15  Yes Adline Potter, MD  lisinopril (PRINIVIL,ZESTRIL) 20 MG tablet Take 1 tablet (20 mg total) by mouth daily. 04/16/15  Yes Adline Potter, MD  Multiple Vitamins-Minerals (MULTIVITAMIN) tablet Take 1 tablet by mouth daily. 01/03/15  Yes Adline Potter, MD    No Known Allergies  History reviewed. No pertinent past surgical history.  Social History  Substance Use Topics  . Smoking status: Never Smoker   . Smokeless tobacco: None  . Alcohol Use: No    Family History  Problem Relation Age of Onset  . Cancer Maternal Uncle   . Cancer Maternal Grandmother     Medication list has been reviewed and  updated.  Physical Examination: BP 128/82 mmHg  Pulse 80  Ht 5\' 8"  (1.727 m)  Wt 241 lb (109.317 kg)  BMI 36.65 kg/m2  Physical Exam  Constitutional: He appears well-developed and well-nourished.  Cardiovascular: Normal rate, regular rhythm and normal heart sounds.   Pulmonary/Chest: Effort normal and breath sounds normal.  Musculoskeletal: He exhibits no edema.  R knee stable with no effusion or pain with patellar displacement  Neurological: He is alert.  Skin: Skin is warm and dry.  Psychiatric: He has a normal mood and affect. His behavior is normal.  Nursing note and vitals reviewed.   Assessment and Plan:  1. Essential hypertension Well controlled, refill lisinopril  2. Vitamin D deficiency Recheck level on supplementation - Vitamin D (25 hydroxy)  3. Knee pain, chronic, right Unclear etiology, refer PT - Ambulatory referral to Physical Therapy  4. Obesity (BMI 30.0-34.9) Stable, exercise/weight loss again discussed   Return in about 6 months (around 10/15/2015).  Satira Anis. [facility]  04/16/2015

## 2015-04-17 LAB — VITAMIN D 25 HYDROXY (VIT D DEFICIENCY, FRACTURES): VIT D 25 HYDROXY: 35.9 ng/mL (ref 30.0–100.0)

## 2015-04-23 ENCOUNTER — Encounter: Payer: Self-pay | Admitting: Physical Therapy

## 2015-04-23 ENCOUNTER — Ambulatory Visit: Payer: Commercial Managed Care - HMO | Attending: Family Medicine | Admitting: Physical Therapy

## 2015-04-23 DIAGNOSIS — M25661 Stiffness of right knee, not elsewhere classified: Secondary | ICD-10-CM | POA: Insufficient documentation

## 2015-04-23 DIAGNOSIS — M25561 Pain in right knee: Secondary | ICD-10-CM | POA: Diagnosis present

## 2015-04-23 DIAGNOSIS — R262 Difficulty in walking, not elsewhere classified: Secondary | ICD-10-CM | POA: Diagnosis present

## 2015-04-23 NOTE — Therapy (Signed)
[facility] [address] [place], [place], [address] Phone: [phone]   Fax:  [phone]  Physical Therapy Evaluation  Patient Details  Name: Dennis Bryant MRN: [id number] Date of Birth: [birth year] Referring Provider: Dr. Vicente Masson  Encounter Date: 04/23/2015      PT End of Session - 04/23/15 1524    Visit Number 1   Number of Visits 8   Date for PT Re-Evaluation 05/21/15   PT Start Time [id number]   PT Stop Time 1500   PT Time Calculation (min) 44 min   Activity Tolerance Patient tolerated treatment well;No increased pain   Behavior During Therapy [facility] for tasks assessed/performed      Past Medical History  Diagnosis Date  . Hypertension   . Vitamin D deficiency     History reviewed. No pertinent past surgical history.  There were no vitals filed for this visit.  Visit Diagnosis:  Recurrent knee pain, right  Joint stiffness of knee, right  Difficulty walking      Subjective Assessment - 04/23/15 1521    Subjective Pt reports R knee pain at 3/10. Pt states the pian has been going on for 4 months and that 2 months ago his ankle began to swell. Pt reports stiffness in the morning and after riding in the car. Pt states his pain and swelling is more noticable at the evening. Pt reports pain with squatting and coming down the steps.    Limitations Lifting;Walking;Standing   Patient Stated Goals return to gym/get R knee function back/decrease pain   Currently in Pain? Yes   Pain Score 3    Pain Location Knee   Pain Orientation Right   Pain Type Chronic pain   Pain Onset More than a month ago            [facility] PT Assessment - 04/23/15 0001    Assessment   Medical Diagnosis chronic R knee pain   Referring Provider Dr. Vicente Masson   Onset Date/Surgical Date 12/09/14        OBJECTIVE: Manual: Patellar mobilizations all 4 direction grade III 30 seconds x 3 each direction. Increased complaint of symptoms with superior  mobilizations. AP tibial mobilization with no reports of increased symptoms. Ice to R knee in supine at end of treatment session.         PT Education - 04/23/15 1524    Education provided Yes   Education Details Pt given hamstring stretching quad stretching in standing and prone and clamshells.    Person(s) Educated Patient   Methods Demonstration;Verbal cues;Handout;Explanation   Comprehension Verbalized understanding;Returned demonstration             PT Long Term Goals - 04/23/15 1525    PT LONG TERM GOAL #1   Title Pt will complete a LEFS to determine self-perceived functional deficits.   Time 4   Period Weeks   Status New   PT LONG TERM GOAL #2   Title Pt will be independent in HEP in order to walk on his jobsite without reports of pain for an hour.   Time 4   Period Weeks   Status New   PT LONG TERM GOAL #3   Title Pt will report return to cardio workouts without reports of R knee pain.   Time 4   Period Weeks   Status New   PT LONG TERM GOAL #4   Title Pt will report no more than 4/10 pain while doing steps throughout the  day.   Baseline pt does 40-50 flights a day/7/10 pain at end of day   Time 4   Period Weeks   Status New             Plan - 04/23/15 1527    Clinical Impression Statement Pt is a pleasant 39 y.o. referred to PT for R knee chronic pain. Pt states current pain is a 3/10 and it gets up to a 7/10 at the end of the day. Pt's B MMT LE is 5/5 without any pain. Pt's AROM is WNL and painfree. Pt reports pain with superior patellar glides and medial joint line pain. Pt is tender to palpation on the medial side of his knee. Pt is (-) for all instability testing and demosntrates good knee stability. Pt will benefit from short term skilled PT in order to return to gym and work demands with decreased anteriofemoral pain.   Pt will benefit from skilled therapeutic intervention in order to improve on the following deficits Abnormal gait;Decreased activity  tolerance;Decreased endurance;Difficulty walking;Pain;Hypomobility;Impaired flexibility;Improper body mechanics;Postural dysfunction;Increased edema;Impaired sensation   Rehab Potential Good   PT Frequency 2x / week   PT Duration 4 weeks   PT Treatment/Interventions ADLs/Self Care Home Management;Cryotherapy;Electrical Stimulation;Moist Heat;Balance training;Therapeutic exercise;Manual techniques;Therapeutic activities;Functional mobility training;Stair training;Gait training;Neuromuscular re-education;Passive range of motion;Dry needling   PT Next Visit Plan reassess patellar mobilty/quad strengthening and balancing VMO and VLO   PT Home Exercise Plan see above   Recommended Other Services continue gym routine   Consulted and Agree with Plan of Care Patient         Problem List Patient Active Problem List   Diagnosis Date Noted  . Hyperlipidemia 01/21/2015  . Vitamin D deficiency 01/21/2015  . Obesity (BMI 30.0-34.9) 01/03/2015  . Hypertension 01/03/2015  . Headache 01/03/2015  . Erectile dysfunction 01/03/2015  . FH: COPD (chronic obstructive pulmonary disease) 01/03/2015    Lavone Neri, SPT 04/23/2015, 3:31 PM  [facility] [address] [place], [place], [address] Phone: [phone]   Fax:  [phone]  Name: Dennis Bryant MRN: [id number] Date of Birth: [birth year]

## 2015-04-29 ENCOUNTER — Ambulatory Visit: Payer: Commercial Managed Care - HMO | Admitting: Physical Therapy

## 2015-04-29 DIAGNOSIS — M25561 Pain in right knee: Secondary | ICD-10-CM

## 2015-04-29 DIAGNOSIS — M25661 Stiffness of right knee, not elsewhere classified: Secondary | ICD-10-CM

## 2015-04-29 DIAGNOSIS — R262 Difficulty in walking, not elsewhere classified: Secondary | ICD-10-CM

## 2015-04-30 ENCOUNTER — Encounter: Payer: Self-pay | Admitting: Physical Therapy

## 2015-04-30 NOTE — Therapy (Signed)
[facility] [address] [place], [place], [address] Phone: [phone]   Fax:  [phone]  Physical Therapy Treatment  Patient Details  Name: Dennis Bryant MRN: [id number] Date of Birth: 1975-10-11 Referring Provider: Dr. Vicente Masson  Encounter Date: 04/29/2015      PT End of Session - 04/30/15 1105    Visit Number 2   Number of Visits 8   Date for PT Re-Evaluation 05/21/15   PT Start Time [id number]   PT Stop Time 1530   PT Time Calculation (min) 51 min   Activity Tolerance Patient tolerated treatment well;No increased pain   Behavior During Therapy [facility] for tasks assessed/performed      Past Medical History  Diagnosis Date  . Hypertension   . Vitamin D deficiency     History reviewed. No pertinent past surgical history.  There were no vitals filed for this visit.  Visit Diagnosis:  Recurrent knee pain, right  Joint stiffness of knee, right  Difficulty walking      Subjective Assessment - 04/30/15 1100    Subjective Pt reports sorness at the end of his work day. Pt states his R knee is doing better with icing and stretching.   Limitations Lifting;Walking;Standing   Patient Stated Goals return to gym/get R knee function back/decrease pain   Currently in Pain? No/denies     OBJECTIVE: Manual: Proximal and distal hamstring stretching bilaterally. Hip flexor stretching bilaterally. Patellar mobilizations R knee all 4 planes grade III 30 seconds x 4. No complaints of pain noted. Medially tilting patellar noted with mobilization. There ex: Total Gym: squats/squats with VMO contraction/squats with VLO contraction x 15 each direction. Total gym single leg squats x 20 each leg. Eccentric step downs from 3" step 20 bilaterally. R medial knee collapse noted; L lateral knee collapse noted. Standing quad stretching. Bilateral quad fasciculation noted. Better control noted on R than L LE with eccentrics.   Pt response to tx for medical  necessity: Pt benefits from quad strengthening to improve patellar alignment with functional mobility.       PT Education - 04/30/15 1103    Education Details Pt given single leg squats/step backs and step downs for quad strengthening and balancing VMO/VLO.   Person(s) Educated Patient   Methods Explanation;Demonstration;Verbal cues;Handout   Comprehension Returned demonstration;Verbalized understanding             PT Long Term Goals - 04/23/15 1525    PT LONG TERM GOAL #1   Title Pt will complete a LEFS to determine self-perceived functional deficits.   Time 4   Period Weeks   Status New   PT LONG TERM GOAL #2   Title Pt will be independent in HEP in order to walk on his jobsite without reports of pain for an hour.   Time 4   Period Weeks   Status New   PT LONG TERM GOAL #3   Title Pt will report return to cardio workouts without reports of R knee pain.   Time 4   Period Weeks   Status New   PT LONG TERM GOAL #4   Title Pt will report no more than 4/10 pain while doing steps throughout the day.   Baseline pt does 40-50 flights a day/7/10 pain at end of day   Time 4   Period Weeks   Status New             Plan - 04/30/15 1105    Clinical  Impression Statement Pt demonstrates L knee lateral collapse with single leg eccentric quad control. Pt demonstrates R knee medial collapse with single leg eccentric quad control. Pt has muscle fasciluations wtih single leg squats on the total gym but reports no pain. Pt has proper patellar tracking with squats on the total gym.    Pt will benefit from skilled therapeutic intervention in order to improve on the following deficits Abnormal gait;Decreased activity tolerance;Decreased endurance;Difficulty walking;Pain;Hypomobility;Impaired flexibility;Improper body mechanics;Postural dysfunction;Increased edema;Impaired sensation   Rehab Potential Good   PT Frequency 2x / week   PT Duration 4 weeks   PT Treatment/Interventions  ADLs/Self Care Home Management;Cryotherapy;Electrical Stimulation;Moist Heat;Balance training;Therapeutic exercise;Manual techniques;Therapeutic activities;Functional mobility training;Stair training;Gait training;Neuromuscular re-education;Passive range of motion;Dry needling   PT Next Visit Plan reassess patellar mobilty/quad strengthening and balancing VMO and VLO   PT Home Exercise Plan see above   Consulted and Agree with Plan of Care Patient        Problem List Patient Active Problem List   Diagnosis Date Noted  . Hyperlipidemia 01/21/2015  . Vitamin D deficiency 01/21/2015  . Obesity (BMI 30.0-34.9) 01/03/2015  . Hypertension 01/03/2015  . Headache 01/03/2015  . Erectile dysfunction 01/03/2015  . FH: COPD (chronic obstructive pulmonary disease) 01/03/2015    Lavone Neri, SPT 04/30/2015, 1:34 PM  [facility] [address] [place], [place], [address] Phone: [phone]   Fax:  [phone]  Name: Dennis Bryant MRN: [id number] Date of Birth: [birth year]

## 2015-05-01 ENCOUNTER — Encounter: Payer: Self-pay | Admitting: Physical Therapy

## 2015-05-01 ENCOUNTER — Ambulatory Visit: Payer: Commercial Managed Care - HMO | Admitting: Physical Therapy

## 2015-05-01 DIAGNOSIS — M25561 Pain in right knee: Secondary | ICD-10-CM

## 2015-05-01 DIAGNOSIS — M25661 Stiffness of right knee, not elsewhere classified: Secondary | ICD-10-CM

## 2015-05-01 DIAGNOSIS — R262 Difficulty in walking, not elsewhere classified: Secondary | ICD-10-CM

## 2015-05-01 NOTE — Therapy (Signed)
[facility] [address] [place], [place], [address] Phone: [phone]   Fax:  [phone]  Physical Therapy Treatment  Patient Details  Name: Dennis Bryant MRN: [id number] Date of Birth: 1976/02/15 Referring Provider: Dr. Vicente Masson  Encounter Date: 05/01/2015      PT End of Session - 05/01/15 1537    Visit Number 3   Number of Visits 8   Date for PT Re-Evaluation 05/21/15   PT Start Time [id number]   PT Stop Time [id number]   PT Time Calculation (min) 51 min   Activity Tolerance Patient tolerated treatment well;No increased pain   Behavior During Therapy [facility] for tasks assessed/performed      Past Medical History  Diagnosis Date  . Hypertension   . Vitamin D deficiency     History reviewed. No pertinent past surgical history.  There were no vitals filed for this visit.  Visit Diagnosis:  Recurrent knee pain, right  Joint stiffness of knee, right  Difficulty walking      Subjective Assessment - 05/01/15 1536    Subjective Pt reports R knee tightness in lateral part. Pt states he is feeling ok and that he did not have to do many steps at work today.    Limitations Lifting;Walking;Standing   Patient Stated Goals return to gym/get R knee function back/decrease pain   Currently in Pain? No/denies         OBJECTIVE: Manual: in supine, patellar mobilization R knee all 4 planes 30 seconds x 4 each direction, grade III. Decreased mobility and play laterally as compared to medially. No complaints of increased pain. STM to R distal quad; increased tightness noted laterally. There ex: Wall ball squats with green therapy ball: 15 normal foot alignment/15 toes out/15 toes in. Increase in symptoms noted with toes out. Eccentric 6" step downs on R LE x 30. Good control noted. BOSU step ups with lunge up x 30 each leg. Resisted front and sideways lunges with 2 BTB x 30 each direction. Ice in sitting with R LE elevated to end tx session.  Pt  response to tx for medical necessity: Pt benefits from strengthening and manual therapy to aid with patellar tracking and return to functional mobility.        PT Education - 04/30/15 1103    Education Details Pt given single leg squats/step backs and step downs for quad strengthening and balancing VMO/VLO.   Person(s) Educated Patient   Methods Explanation;Demonstration;Verbal cues;Handout   Comprehension Returned demonstration;Verbalized understanding             PT Long Term Goals - 04/23/15 1525    PT LONG TERM GOAL #1   Title Pt will complete a LEFS to determine self-perceived functional deficits.   Time 4   Period Weeks   Status New   PT LONG TERM GOAL #2   Title Pt will be independent in HEP in order to walk on his jobsite without reports of pain for an hour.   Time 4   Period Weeks   Status New   PT LONG TERM GOAL #3   Title Pt will report return to cardio workouts without reports of R knee pain.   Time 4   Period Weeks   Status New   PT LONG TERM GOAL #4   Title Pt will report no more than 4/10 pain while doing steps throughout the day.   Baseline pt does 40-50 flights a day/7/10 pain at end of day  Time 4   Period Weeks   Status New           Plan - 05/01/15 1537    Clinical Impression Statement Pt reports increase in R lateral (VLO) pain with toe out squats. Pt demonstrates R lateral knee collapse during eccentric quad control. Pt finds relief from mild lateral tracking assistance from PT with step downs. Pt demonstrates good control with LE step ups on BOSU without hands.  Pt has good patellar mobility with decreased play to the lateral side as compared to medially.    Pt will benefit from skilled therapeutic intervention in order to improve on the following deficits Abnormal gait;Decreased activity tolerance;Decreased endurance;Difficulty walking;Pain;Hypomobility;Impaired flexibility;Improper body mechanics;Postural dysfunction;Increased edema;Impaired  sensation   Rehab Potential Good   PT Frequency 2x / week   PT Duration 4 weeks   PT Treatment/Interventions ADLs/Self Care Home Management;Cryotherapy;Electrical Stimulation;Moist Heat;Balance training;Therapeutic exercise;Manual techniques;Therapeutic activities;Functional mobility training;Stair training;Gait training;Neuromuscular re-education;Passive range of motion;Dry needling   PT Next Visit Plan reassess patellar mobilty/quad strengthening and balancing VMO and VLO   PT Home Exercise Plan see above   Consulted and Agree with Plan of Care Patient        Problem List Patient Active Problem List   Diagnosis Date Noted  . Hyperlipidemia 01/21/2015  . Vitamin D deficiency 01/21/2015  . Obesity (BMI 30.0-34.9) 01/03/2015  . Hypertension 01/03/2015  . Headache 01/03/2015  . Erectile dysfunction 01/03/2015  . FH: COPD (chronic obstructive pulmonary disease) 01/03/2015    Lavone Neri, SPT 05/01/2015, 3:40 PM  [facility] [address] [place], [place], [address] Phone: [phone]   Fax:  [phone]  Name: Dennis Bryant MRN: [id number] Date of Birth: 15-Sep-1975

## 2015-05-06 ENCOUNTER — Ambulatory Visit: Payer: Commercial Managed Care - HMO | Admitting: Physical Therapy

## 2015-05-06 ENCOUNTER — Encounter: Payer: Self-pay | Admitting: Physical Therapy

## 2015-05-06 DIAGNOSIS — M25561 Pain in right knee: Secondary | ICD-10-CM | POA: Diagnosis not present

## 2015-05-06 DIAGNOSIS — M25661 Stiffness of right knee, not elsewhere classified: Secondary | ICD-10-CM

## 2015-05-06 DIAGNOSIS — R262 Difficulty in walking, not elsewhere classified: Secondary | ICD-10-CM

## 2015-05-06 NOTE — Therapy (Signed)
[facility] [address] [place], [place], [address] Phone: [phone]   Fax:  [phone]  Physical Therapy Treatment  Patient Details  Name: Dennis Bryant MRN: [id number] Date of Birth: 09-12-1975 Referring Provider: Dr. Vicente Masson  Encounter Date: 05/06/2015      PT End of Session - 05/06/15 1517    Visit Number 4   Number of Visits 8   Date for PT Re-Evaluation 05/21/15   PT Start Time [id number]   PT Stop Time 1527   PT Time Calculation (min) 46 min   Activity Tolerance Patient tolerated treatment well;No increased pain   Behavior During Therapy [facility] for tasks assessed/performed      Past Medical History  Diagnosis Date  . Hypertension   . Vitamin D deficiency     History reviewed. No pertinent past surgical history.  There were no vitals filed for this visit.  Visit Diagnosis:  Recurrent knee pain, right  Joint stiffness of knee, right  Difficulty walking      Subjective Assessment - 05/06/15 1516    Subjective Pt reports feeling well and that he has been icing with his long weekend. Pt states his knee is feeling well and has no new complaints.    Limitations Lifting;Walking;Standing   Patient Stated Goals return to gym/get R knee function back/decrease pain   Currently in Pain? No/denies      OBJECTIVE: Manual: Patellar mobilizations grade III emphasis on medial mobilization. Good tracking noted.  Reviewed stretching program.  There ex: Wall squats with green therapy ball with toes out x 30. Single leg pistol squats x 8 each leg; fatigue and muscle weakness limiting. Total gym isometric push off single leg x 2 mins. BOSU balance single leg. BOSU toes out and toes in squats x 30 each direction. Star Drill with R LE balance; ended due to R arch pain. Prostretch to end the session.  Pt response to tx for medical necessity: Pt benefits from strengthening to increase proper quad strengthening and patellar tracking.         PT Long Term Goals - 04/23/15 1525    PT LONG TERM GOAL #1   Title Pt will complete a LEFS to determine self-perceived functional deficits.   Time 4   Period Weeks   Status New   PT LONG TERM GOAL #2   Title Pt will be independent in HEP in order to walk on his jobsite without reports of pain for an hour.   Time 4   Period Weeks   Status New   PT LONG TERM GOAL #3   Title Pt will report return to cardio workouts without reports of R knee pain.   Time 4   Period Weeks   Status New   PT LONG TERM GOAL #4   Title Pt will report no more than 4/10 pain while doing steps throughout the day.   Baseline pt does 40-50 flights a day/7/10 pain at end of day   Time 4   Period Weeks   Status New               Plan - 05/06/15 1517    Clinical Impression Statement Pt demonstrates proper squatting techinque on a variety of surfaces. Pt is able to perform isometric push off on the total gym without complaints of increased pain. Pt has difficulty with single leg squats secondary to weakness. Tx session ended early due to complaints of R arch pain.   Pt will  benefit from skilled therapeutic intervention in order to improve on the following deficits Abnormal gait;Decreased activity tolerance;Decreased endurance;Difficulty walking;Pain;Hypomobility;Impaired flexibility;Improper body mechanics;Postural dysfunction;Increased edema;Impaired sensation   Rehab Potential Good   PT Frequency 2x / week   PT Duration 4 weeks   PT Treatment/Interventions ADLs/Self Care Home Management;Cryotherapy;Electrical Stimulation;Moist Heat;Balance training;Therapeutic exercise;Manual techniques;Therapeutic activities;Functional mobility training;Stair training;Gait training;Neuromuscular re-education;Passive range of motion;Dry needling   PT Next Visit Plan quad strengthening/isometrics/single leg stance   PT Home Exercise Plan see above   Consulted and Agree with Plan of Care Patient        Problem  List Patient Active Problem List   Diagnosis Date Noted  . Hyperlipidemia 01/21/2015  . Vitamin D deficiency 01/21/2015  . Obesity (BMI 30.0-34.9) 01/03/2015  . Hypertension 01/03/2015  . Headache 01/03/2015  . Erectile dysfunction 01/03/2015  . FH: COPD (chronic obstructive pulmonary disease) 01/03/2015   Pura Spice, PT, DPT # [phone]   05/07/2015, 10:08 AM  [facility] [address] [place], [place], [address] Phone: [phone]   Fax:  [phone]  Name: Dennis Bryant MRN: [id number] Date of Birth: 1975-11-24

## 2015-05-08 ENCOUNTER — Encounter: Payer: Commercial Managed Care - HMO | Admitting: Physical Therapy

## 2015-05-13 ENCOUNTER — Ambulatory Visit: Payer: Commercial Managed Care - HMO | Attending: Family Medicine | Admitting: Physical Therapy

## 2015-05-13 DIAGNOSIS — M25561 Pain in right knee: Secondary | ICD-10-CM

## 2015-05-13 DIAGNOSIS — M25661 Stiffness of right knee, not elsewhere classified: Secondary | ICD-10-CM | POA: Diagnosis present

## 2015-05-13 DIAGNOSIS — R262 Difficulty in walking, not elsewhere classified: Secondary | ICD-10-CM | POA: Diagnosis present

## 2015-05-14 NOTE — Therapy (Signed)
[facility] [address] [place], [place], [address] Phone: [phone]   Fax:  [phone]  Physical Therapy Treatment  Patient Details  Name: Dennis Bryant MRN: [id number] Date of Birth: 10-13-1975 Referring Provider: Dr. Vicente Masson  Encounter Date: 05/13/2015      PT End of Session - 05/14/15 0823    Visit Number 5   Number of Visits 8   Date for PT Re-Evaluation 05/21/15   PT Start Time 7591   PT Stop Time 1525   PT Time Calculation (min) 46 min   Activity Tolerance Patient tolerated treatment well;No increased pain   Behavior During Therapy [facility] for tasks assessed/performed      Past Medical History  Diagnosis Date  . Hypertension   . Vitamin D deficiency     No past surgical history on file.  There were no vitals filed for this visit.  Visit Diagnosis:  Recurrent knee pain, right  Joint stiffness of knee, right  Difficulty walking      Subjective Assessment - 05/14/15 0822    Subjective Pt reports some R knee swelling over the weeeknd after leg day at the gym with his son. Pt reports R knee feeling stiff but denies pain.   Limitations Lifting;Walking;Standing   Patient Stated Goals return to gym/get R knee function back/decrease pain   Currently in Pain? No/denies     OBJECTIVE: There ex: Total Gym 3 mins of single leg push off. BOSU squats toes normal/toes out/toe in x 30 each direction. Minimal support by the bars to keep balance. Box jumps x 10 with proper form and knee alignment demonstrated. Step backs and forwards lunges x 30 each leg. Step ups onto 16" step with minimal UE support with R LE x 30.  Reviewed entire HEP/ progression.  Pt response to tx for medical necessity: Pt demonstrates proper form and ability to return to the gym pain free. At this time, pt is discharged from skilled PT.       PT Long Term Goals - 05/14/15 0827    PT LONG TERM GOAL #1   Title Pt will complete a LEFS to determine  self-perceived functional deficits.   Baseline 65/80   Time 4   Period Weeks   Status Achieved   PT LONG TERM GOAL #2   Title Pt will be independent in HEP in order to walk on his jobsite without reports of pain for an hour.   Time 4   Period Weeks   Status Achieved   PT LONG TERM GOAL #3   Title Pt will report return to cardio workouts without reports of R knee pain.   Time 4   Period Weeks   Status Achieved   PT LONG TERM GOAL #4   Title Pt will report no more than 4/10 pain while doing steps throughout the day.   Baseline pt does 40-50 flights a day/7/10 pain at end of day   Time 4   Period Weeks   Status Partially Met            Plan - 05/14/15 6384    Clinical Impression Statement Pt demonstrates proper squatting form and single leg stance with push off on the total gym. Pt shows proper form with box jumps. Pt demonstrates good patellar tracking and proper mechanics with all motion. At this time, pt demonstrates proper mechanics and independence so is discharged to independent home program.    Pt will benefit from skilled therapeutic intervention  in order to improve on the following deficits Abnormal gait;Decreased activity tolerance;Decreased endurance;Difficulty walking;Pain;Hypomobility;Impaired flexibility;Improper body mechanics;Postural dysfunction;Increased edema;Impaired sensation   Rehab Potential Good   PT Treatment/Interventions ADLs/Self Care Home Management;Cryotherapy;Electrical Stimulation;Moist Heat;Balance training;Therapeutic exercise;Manual techniques;Therapeutic activities;Functional mobility training;Stair training;Gait training;Neuromuscular re-education;Passive range of motion;Dry needling   PT Home Exercise Plan continue gym routine.   Consulted and Agree with Plan of Care Patient        Problem List Patient Active Problem List   Diagnosis Date Noted  . Hyperlipidemia 01/21/2015  . Vitamin D deficiency 01/21/2015  . Obesity (BMI 30.0-34.9)  01/03/2015  . Hypertension 01/03/2015  . Headache 01/03/2015  . Erectile dysfunction 01/03/2015  . FH: COPD (chronic obstructive pulmonary disease) 01/03/2015    Lavone Neri, SPT 05/14/2015, 8:28 AM  [facility] [address] [place], [place], [address] Phone: [phone]   Fax:  [phone]  Name: Dennis Bryant MRN: [id number] Date of Birth: 1976/02/23

## 2015-05-26 ENCOUNTER — Encounter: Payer: Self-pay | Admitting: Dietician

## 2015-05-26 NOTE — Progress Notes (Signed)
Have not heard back from patient to reschedule appointment missed on 03/17/15. Sent discharge letter to MD.

## 2015-09-12 ENCOUNTER — Ambulatory Visit
Admission: EM | Admit: 2015-09-12 | Discharge: 2015-09-12 | Disposition: A | Discharge status: Home / Self Care | Payer: Commercial Managed Care - HMO | Attending: Family Medicine | Admitting: Family Medicine

## 2015-09-12 ENCOUNTER — Encounter: Payer: Self-pay | Admitting: Emergency Medicine

## 2015-09-12 DIAGNOSIS — J410 Simple chronic bronchitis: Secondary | ICD-10-CM

## 2015-09-12 DIAGNOSIS — R062 Wheezing: Secondary | ICD-10-CM

## 2015-09-12 MED ORDER — ALBUTEROL SULFATE HFA 108 (90 BASE) MCG/ACT IN AERS: 2.0000 | INHALATION_SPRAY | Freq: Four times a day (QID) | RESPIRATORY_TRACT | Status: DC | PRN

## 2015-09-12 MED ORDER — AZITHROMYCIN 250 MG PO TABS: ORAL_TABLET | ORAL | Status: DC

## 2015-09-12 NOTE — ED Notes (Signed)
Patient c/o cough and chest congestion for 3 weeks.  Patient denies fevers.  

## 2015-09-12 NOTE — ED Provider Notes (Signed)
CSN: [id number]     Arrival date & time 09/12/15  1026 History   First MD Initiated Contact with Patient 09/12/15 1105     Chief Complaint  Patient presents with  . Cough   (Consider location/radiation/quality/duration/timing/severity/associated sxs/prior Treatment) Patient is a 40 y.o. male presenting with URI. The history is provided by the patient.  URI Presenting symptoms: congestion, cough and fatigue   Severity:  Moderate Onset quality:  Sudden Duration:  4 weeks Timing:  Constant Progression:  Unchanged Chronicity:  New Relieved by:  Nothing Ineffective treatments:  OTC medications Associated symptoms: wheezing   Associated symptoms: no headaches, no myalgias and no sinus pain   Risk factors: not elderly, no chronic cardiac disease, no chronic kidney disease, no chronic respiratory disease, no immunosuppression, no recent illness, no recent travel and no sick contacts     Past Medical History  Diagnosis Date  . Hypertension   . Vitamin D deficiency    History reviewed. No pertinent past surgical history. Family History  Problem Relation Age of Onset  . Cancer Maternal Uncle   . Cancer Maternal Grandmother    Social History  Substance Use Topics  . Smoking status: Never Smoker   . Smokeless tobacco: None  . Alcohol Use: No    Review of Systems  Constitutional: Positive for fatigue.  HENT: Positive for congestion.   Respiratory: Positive for cough and wheezing.   Musculoskeletal: Negative for myalgias.  Neurological: Negative for headaches.    Allergies  Review of patient's allergies indicates no known allergies.  Home Medications   Prior to Admission medications   Medication Sig Start Date End Date Taking? Authorizing Provider  albuterol (PROVENTIL HFA;VENTOLIN HFA) 108 (90 Base) MCG/ACT inhaler Inhale 2 puffs into the lungs every 6 (six) hours as needed for wheezing or shortness of breath. 09/12/15   Norval Gable, MD  azithromycin (ZITHROMAX Z-PAK) 250  MG tablet 2 tabs po once day 1, then 1 tab po qd for next 4 days 09/12/15   Norval Gable, MD  Cholecalciferol (VITAMIN D) 2000 UNITS CAPS Take 1 capsule (2,000 Units total) by mouth daily. 01/14/15   Adline Potter, MD  lisinopril (PRINIVIL,ZESTRIL) 20 MG tablet Take 1 tablet (20 mg total) by mouth daily. Patient taking differently: Take 10 mg by mouth daily.  04/16/15   Adline Potter, MD  Multiple Vitamins-Minerals (MULTIVITAMIN) tablet Take 1 tablet by mouth daily. 01/03/15   Adline Potter, MD   Meds Ordered and Administered this Visit  Medications - No data to display  BP 139/98 mmHg  Pulse 62  Temp(Src) 97.2 F (36.2 C) (Tympanic)  Resp 17  Ht 5\' 8"  (1.727 m)  Wt 245 lb (111.131 kg)  BMI 37.26 kg/m2  SpO2 98% No data found.   Physical Exam  Constitutional: He appears well-developed and well-nourished. No distress.  HENT:  Head: Normocephalic and atraumatic.  Right Ear: Tympanic membrane, external ear and ear canal normal.  Left Ear: Tympanic membrane, external ear and ear canal normal.  Nose: Nose normal.  Mouth/Throat: Uvula is midline, oropharynx is clear and moist and mucous membranes are normal. No oropharyngeal exudate or tonsillar abscesses.  Eyes: Conjunctivae and EOM are normal. Pupils are equal, round, and reactive to light. Right eye exhibits no discharge. Left eye exhibits no discharge. No scleral icterus.  Neck: Normal range of motion. Neck supple. No tracheal deviation present. No thyromegaly present.  Cardiovascular: Normal rate, regular rhythm and normal heart sounds.   Pulmonary/Chest: Effort normal. No stridor. No  respiratory distress. He has wheezes (mild diffuse expiratory wheezes). He has no rales. He exhibits no tenderness.  Lymphadenopathy:    He has no cervical adenopathy.  Neurological: He is alert.  Skin: Skin is warm and dry. No rash noted. He is not diaphoretic.  Nursing note and vitals reviewed.   ED Course  Procedures (including critical care  time)  Labs Review Labs Reviewed - No data to display  Imaging Review No results found.   Visual Acuity Review  Right Eye Distance:   Left Eye Distance:   Bilateral Distance:    Right Eye Near:   Left Eye Near:    Bilateral Near:         MDM   1. Wheezing   2. Simple chronic bronchitis ([place])    Discharge Medication List as of 09/12/2015 11:52 AM    START taking these medications   Details  albuterol (PROVENTIL HFA;VENTOLIN HFA) 108 (90 Base) MCG/ACT inhaler Inhale 2 puffs into the lungs every 6 (six) hours as needed for wheezing or shortness of breath., Starting 09/12/2015, Until Discontinued, Normal    azithromycin (ZITHROMAX Z-PAK) 250 MG tablet 2 tabs po once day 1, then 1 tab po qd for next 4 days, Normal       1. diagnosis reviewed with patient 2. rx as per orders above; reviewed possible side effects, interactions, risks and benefits  3. Recommend supportive treatment with rest, increased fluids 4. Follow-up prn if symptoms worsen or don't improve    Norval Gable, MD 09/12/15 1418

## 2015-10-17 ENCOUNTER — Ambulatory Visit (INDEPENDENT_AMBULATORY_CARE_PROVIDER_SITE_OTHER): Payer: Commercial Managed Care - HMO | Admitting: Family Medicine

## 2015-10-17 ENCOUNTER — Encounter: Payer: Self-pay | Admitting: Family Medicine

## 2015-10-17 VITALS — BP 118/78 | HR 64 | Resp 16 | Ht 68.0 in | Wt 243.0 lb

## 2015-10-17 DIAGNOSIS — E669 Obesity, unspecified: Secondary | ICD-10-CM | POA: Diagnosis not present

## 2015-10-17 DIAGNOSIS — I10 Essential (primary) hypertension: Secondary | ICD-10-CM

## 2015-10-17 DIAGNOSIS — E785 Hyperlipidemia, unspecified: Secondary | ICD-10-CM

## 2015-10-17 DIAGNOSIS — E559 Vitamin D deficiency, unspecified: Secondary | ICD-10-CM

## 2015-10-17 MED ORDER — LISINOPRIL 10 MG PO TABS: 10.0000 mg | ORAL_TABLET | Freq: Every day | ORAL | Status: DC

## 2015-10-17 NOTE — Progress Notes (Signed)
Date:  10/17/2015   Name:  Dennis Bryant   DOB:  Aug 31, 1975   MRN:  [id number]  PCP:  Adline Potter, MD    Chief Complaint: Hypertension   History of Present Illness:  This is a 40 y.o. male seen for 6 month f/u. Taking lisinopril 10 mg daily only. On vit D supplement. Knee pain resolved with PT. Weight about the same but starting boot camp this weekend.   Review of Systems:  Review of Systems  Constitutional: Negative for fever and fatigue.  Respiratory: Negative for cough and shortness of breath.   Cardiovascular: Negative for chest pain and leg swelling.  Neurological: Negative for syncope and light-headedness.    Patient Active Problem List   Diagnosis Date Noted  . Hyperlipidemia 01/21/2015  . Vitamin D deficiency 01/21/2015  . Obesity (BMI 30.0-34.9) 01/03/2015  . Hypertension 01/03/2015  . Headache 01/03/2015  . Erectile dysfunction 01/03/2015  . FH: COPD (chronic obstructive pulmonary disease) 01/03/2015    Prior to Admission medications   Medication Sig Start Date End Date Taking? Authorizing Provider  Cholecalciferol (VITAMIN D) 2000 UNITS CAPS Take 1 capsule (2,000 Units total) by mouth daily. 01/14/15  Yes Adline Potter, MD  lisinopril (PRINIVIL,ZESTRIL) 10 MG tablet Take 1 tablet (10 mg total) by mouth daily. 10/17/15  Yes Adline Potter, MD  Multiple Vitamins-Minerals (MULTIVITAMIN) tablet Take 1 tablet by mouth daily. 01/03/15  Yes Adline Potter, MD    No Known Allergies  History reviewed. No pertinent past surgical history.  Social History  Substance Use Topics  . Smoking status: Never Smoker   . Smokeless tobacco: None  . Alcohol Use: No    Family History  Problem Relation Age of Onset  . Cancer Maternal Uncle   . Cancer Maternal Grandmother     Medication list has been reviewed and updated.  Physical Examination: BP 118/78 mmHg  Pulse 64  Resp 16  Ht 5\' 8"  (1.727 m)  Wt 243 lb (110.224 kg)  BMI 36.96 kg/m2  SpO2 95%  Physical Exam   Constitutional: He appears well-developed and well-nourished.  Cardiovascular: Normal rate, regular rhythm and normal heart sounds.   Pulmonary/Chest: Effort normal and breath sounds normal.  Musculoskeletal: He exhibits no edema.  Neurological: He is alert.  Skin: Skin is warm and dry.  Psychiatric: He has a normal mood and affect. His behavior is normal.  Nursing note and vitals reviewed.   Assessment and Plan:  1. Essential hypertension Well controlled, refill lisinopril  2. Obesity (BMI 30.0-34.9) Exercise/weight loss discussed, starting boot camp soon  3. Hyperlipidemia 10 yr CVD 2.2%, no indication for treatment  4. Vitamin D deficiency Well controlled on supplement  Return in about 6 months (around 04/17/2016).  Satira Anis. [facility]  10/17/2015

## 2016-01-19 ENCOUNTER — Other Ambulatory Visit: Payer: Self-pay

## 2016-01-19 NOTE — Telephone Encounter (Signed)
Pt called concerning B/P reading- said that [facility] had "accidentally gave me 20mg  of my Lisinopril, can I keep taking that because I feel better on that than the 10mg ." I had him take his B/P reading at pharm and call me back- reading was 157/101. I told him to take the 20mg  and set up an appt for this week for a follow up B/P reading to make sure that the 20mg  daily is going to bring B/P down.

## 2016-01-20 MED ORDER — LISINOPRIL 20 MG PO TABS: 20.0000 mg | ORAL_TABLET | Freq: Every day | ORAL | 0 refills | Status: DC

## 2016-01-22 ENCOUNTER — Encounter: Payer: Self-pay | Admitting: Family Medicine

## 2016-01-22 ENCOUNTER — Ambulatory Visit (INDEPENDENT_AMBULATORY_CARE_PROVIDER_SITE_OTHER): Payer: Commercial Managed Care - HMO | Admitting: Family Medicine

## 2016-01-22 VITALS — BP 132/74 | HR 68 | Ht 68.0 in | Wt 248.0 lb

## 2016-01-22 DIAGNOSIS — I10 Essential (primary) hypertension: Secondary | ICD-10-CM

## 2016-01-22 MED ORDER — LISINOPRIL 10 MG PO TABS: 10.0000 mg | ORAL_TABLET | Freq: Two times a day (BID) | ORAL | 1 refills | Status: DC

## 2016-01-22 NOTE — Progress Notes (Signed)
Name: Dennis Bryant   MRN: [id number]    DOB: June 14, 1975   Date:01/22/2016       Progress Note  Subjective  Chief Complaint  Chief Complaint  Patient presents with  . Hypertension    has been taking Lisinopril 20mg - 140/104    Hypertension  This is a chronic problem. The current episode started more than 1 year ago. The problem has been gradually improving since onset. The problem is controlled (early AM breakthrough). Pertinent negatives include no anxiety, blurred vision, chest pain, headaches, malaise/fatigue, neck pain, orthopnea, palpitations, peripheral edema, PND, shortness of breath or sweats. There are no associated agents to hypertension. There are no known risk factors for coronary artery disease. Past treatments include ACE inhibitors. The current treatment provides moderate improvement. There is no history of angina, kidney disease, CAD/MI, CVA, heart failure, left ventricular hypertrophy, PVD, renovascular disease or retinopathy. There is no history of chronic renal disease or a hypertension causing med.    No problem-specific Assessment & Plan notes found for this encounter.   Past Medical History:  Diagnosis Date  . Hypertension   . Vitamin D deficiency     History reviewed. No pertinent surgical history.  Family History  Problem Relation Age of Onset  . Cancer Maternal Uncle   . Cancer Maternal Grandmother     Social History   Social History  . Marital status: Married    Spouse name: N/A  . Number of children: N/A  . Years of education: N/A   Occupational History  . Not on file.   Social History Main Topics  . Smoking status: Never Smoker  . Smokeless tobacco: Not on file  . Alcohol use No  . Drug use: No  . Sexual activity: Yes   Other Topics Concern  . Not on file   Social History Narrative  . No narrative on file    No Known Allergies   Review of Systems  Constitutional: Negative for chills, fever, malaise/fatigue and weight loss.   HENT: Negative for ear discharge, ear pain and sore throat.   Eyes: Negative for blurred vision.  Respiratory: Negative for cough, sputum production, shortness of breath and wheezing.   Cardiovascular: Negative for chest pain, palpitations, orthopnea, leg swelling and PND.  Gastrointestinal: Negative for abdominal pain, blood in stool, constipation, diarrhea, heartburn, melena and nausea.  Genitourinary: Negative for dysuria, frequency, hematuria and urgency.  Musculoskeletal: Negative for back pain, joint pain, myalgias and neck pain.  Skin: Negative for rash.  Neurological: Negative for dizziness, tingling, sensory change, focal weakness and headaches.  Endo/Heme/Allergies: Negative for environmental allergies and polydipsia. Does not bruise/bleed easily.  Psychiatric/Behavioral: Negative for depression and suicidal ideas. The patient is not nervous/anxious and does not have insomnia.      Objective  Vitals:   01/22/16 1446  BP: 132/74  Pulse: 68  Weight: 248 lb (112.5 kg)  Height: 5\' 8"  (1.727 m)    Physical Exam  Constitutional: He is oriented to person, place, and time and well-developed, well-nourished, and in no distress.  HENT:  Head: Normocephalic.  Right Ear: External ear normal.  Left Ear: External ear normal.  Nose: Nose normal.  Mouth/Throat: Oropharynx is clear and moist.  Eyes: Conjunctivae and EOM are normal. Pupils are equal, round, and reactive to light. Right eye exhibits no discharge. Left eye exhibits no discharge. No scleral icterus.  Neck: Normal range of motion. Neck supple. No JVD present. No tracheal deviation present. No thyromegaly present.  Cardiovascular: Normal rate,  regular rhythm, normal heart sounds and intact distal pulses.  Exam reveals no gallop and no friction rub.   No murmur heard. Pulmonary/Chest: Breath sounds normal. No respiratory distress. He has no wheezes. He has no rales.  Abdominal: Soft. Bowel sounds are normal. He exhibits no  mass. There is no hepatosplenomegaly. There is no tenderness. There is no rebound, no guarding and no CVA tenderness.  Musculoskeletal: Normal range of motion. He exhibits no edema or tenderness.  Lymphadenopathy:    He has no cervical adenopathy.  Neurological: He is alert and oriented to person, place, and time. He has normal sensation, normal strength and intact cranial nerves. No cranial nerve deficit.  Skin: Skin is warm. No rash noted.  Psychiatric: Mood and affect normal.  Nursing note and vitals reviewed.     Assessment & Plan  Problem List Items Addressed This Visit      Cardiovascular and Mediastinum   Hypertension - Primary   Relevant Medications   lisinopril (PRINIVIL,ZESTRIL) 10 MG tablet   Other Relevant Orders   Renal Function Panel    Other Visit Diagnoses   None.       Dr. [facility]  01/22/16

## 2016-01-23 LAB — RENAL FUNCTION PANEL
Albumin: 4.6 g/dL (ref 3.5–5.5)
BUN/Creatinine Ratio: 13 (ref 9–20)
BUN: 15 mg/dL (ref 6–24)
CO2: 27 mmol/L (ref 18–29)
CREATININE: 1.16 mg/dL (ref 0.76–1.27)
Calcium: 9.6 mg/dL (ref 8.7–10.2)
Chloride: 96 mmol/L (ref 96–106)
GFR, EST AFRICAN AMERICAN: 90 mL/min/{1.73_m2} (ref >59)
GFR, EST NON AFRICAN AMERICAN: 78 mL/min/{1.73_m2} (ref >59)
GLUCOSE: 88 mg/dL (ref 65–99)
PHOSPHORUS: 3.7 mg/dL (ref 2.5–4.5)
Potassium: 4.6 mmol/L (ref 3.5–5.2)
SODIUM: 138 mmol/L (ref 134–144)

## 2016-02-18 ENCOUNTER — Other Ambulatory Visit: Payer: Self-pay | Admitting: Family Medicine

## 2016-04-19 ENCOUNTER — Ambulatory Visit: Payer: Commercial Managed Care - HMO | Admitting: Family Medicine

## 2016-07-18 ENCOUNTER — Encounter (HOSPITAL_BASED_OUTPATIENT_CLINIC_OR_DEPARTMENT_OTHER): Payer: Self-pay | Admitting: Emergency Medicine

## 2016-07-18 ENCOUNTER — Emergency Department (HOSPITAL_BASED_OUTPATIENT_CLINIC_OR_DEPARTMENT_OTHER): Payer: Commercial Managed Care - HMO

## 2016-07-18 ENCOUNTER — Emergency Department (HOSPITAL_BASED_OUTPATIENT_CLINIC_OR_DEPARTMENT_OTHER)
Admission: EM | Admit: 2016-07-18 | Discharge: 2016-07-19 | Disposition: A | Discharge status: Home / Self Care | Payer: Commercial Managed Care - HMO | Attending: Emergency Medicine | Admitting: Emergency Medicine

## 2016-07-18 DIAGNOSIS — R202 Paresthesia of skin: Secondary | ICD-10-CM

## 2016-07-18 DIAGNOSIS — R200 Anesthesia of skin: Secondary | ICD-10-CM | POA: Diagnosis present

## 2016-07-18 DIAGNOSIS — I10 Essential (primary) hypertension: Secondary | ICD-10-CM | POA: Diagnosis not present

## 2016-07-18 DIAGNOSIS — G44209 Tension-type headache, unspecified, not intractable: Secondary | ICD-10-CM | POA: Diagnosis not present

## 2016-07-18 LAB — COMPREHENSIVE METABOLIC PANEL
ALT: 72 U/L — AB (ref 17–63)
AST: 48 U/L — AB (ref 15–41)
Albumin: 4 g/dL (ref 3.5–5.0)
Alkaline Phosphatase: 89 U/L (ref 38–126)
Anion gap: 8 (ref 5–15)
BILIRUBIN TOTAL: 1 mg/dL (ref 0.3–1.2)
BUN: 13 mg/dL (ref 6–20)
CALCIUM: 9.2 mg/dL (ref 8.9–10.3)
CO2: 24 mmol/L (ref 22–32)
CREATININE: 1.02 mg/dL (ref 0.61–1.24)
Chloride: 104 mmol/L (ref 101–111)
GFR calc Af Amer: >60 mL/min (ref >60)
Glucose, Bld: 101 mg/dL — ABNORMAL HIGH (ref 65–99)
Potassium: 4 mmol/L (ref 3.5–5.1)
Sodium: 136 mmol/L (ref 135–145)
TOTAL PROTEIN: 7.8 g/dL (ref 6.5–8.1)

## 2016-07-18 LAB — CBC WITH DIFFERENTIAL/PLATELET
BASOS ABS: 0 10*3/uL (ref 0.0–0.1)
Basophils Relative: 1 %
Eosinophils Absolute: 0.3 10*3/uL (ref 0.0–0.7)
Eosinophils Relative: 3 %
HEMATOCRIT: 45 % (ref 39.0–52.0)
Hemoglobin: 15.3 g/dL (ref 13.0–17.0)
LYMPHS PCT: 23 %
Lymphs Abs: 1.8 10*3/uL (ref 0.7–4.0)
MCH: 28.1 pg (ref 26.0–34.0)
MCHC: 34 g/dL (ref 30.0–36.0)
MCV: 82.6 fL (ref 78.0–100.0)
Monocytes Absolute: 0.6 10*3/uL (ref 0.1–1.0)
Monocytes Relative: 8 %
NEUTROS ABS: 5.2 10*3/uL (ref 1.7–7.7)
Neutrophils Relative %: 65 %
Platelets: 312 10*3/uL (ref 150–400)
RBC: 5.45 MIL/uL (ref 4.22–5.81)
RDW: 13.6 % (ref 11.5–15.5)
WBC: 7.9 10*3/uL (ref 4.0–10.5)

## 2016-07-18 MED ORDER — KETOROLAC TROMETHAMINE 30 MG/ML IJ SOLN: 30.0000 mg | Freq: Once | INTRAMUSCULAR | Status: DC

## 2016-07-18 NOTE — ED Triage Notes (Signed)
Pt presents to ED with c/o headache, numbness to right outside of right foot , fingers 4 and 5 on both hands, and right upper lip and tip of tongue and high blood pressure.

## 2016-07-18 NOTE — ED Notes (Signed)
Pt states HA pain is "still the same", numbness has moved around but is still BIL, and states most recent BP is baseline for him.

## 2016-07-18 NOTE — ED Provider Notes (Signed)
[place] DEPT [facility] Provider Note   CSN: [id number] Arrival date & time: 07/18/16  2157  By signing my name below, I, Sonum Patel, attest that this documentation has been prepared under the direction and in the presence of Merryl Hacker, MD. Electronically Signed: Ludger Nutting, Scribe. 07/18/16. 11:23 PM.  History   Chief Complaint Chief Complaint  Patient presents with  . Numbness    The history is provided by the patient. No language interpreter was used.     HPI Comments: Dennis Bryant is a 41 y.o. male who presents to the Emergency Department complaining of an episode of numbness that occurred earlier this evening and lasted for 3 hours. He reports having numbness to the right upper and lower lips, tip of tongue, bilateral 4th and 5th digits, and right 4th and 5th toes. He states these symptoms have since resolved. He also complains of a posterior HA for the past few days. He describes his HA as a tightness and occasionally pulsing; he rates it as 5/10 currently. He reports a history of HTN and believes his HA is due to elevated blood pressure.  As he has had a similar headache in the past. He denies weakness, CP, SOB, abdominal pain, vision changes, speech difficulty. He denies a history of CVA/TIA or any other chronic medical conditions. He denies any known medication allergies. He is a non-smoker and does not drink alcohol.  Past Medical History:  Diagnosis Date  . Hypertension   . Vitamin D deficiency     Patient Active Problem List   Diagnosis Date Noted  . Hyperlipidemia 01/21/2015  . Vitamin D deficiency 01/21/2015  . Obesity (BMI 30.0-34.9) 01/03/2015  . Hypertension 01/03/2015  . Headache 01/03/2015  . Erectile dysfunction 01/03/2015  . FH: COPD (chronic obstructive pulmonary disease) 01/03/2015    History reviewed. No pertinent surgical history.     Home Medications    Prior to Admission medications   Medication Sig Start Date End Date Taking?  Authorizing Provider  aspirin 81 MG chewable tablet Chew 4 tablets (324 mg total) by mouth daily. 07/19/16   Merryl Hacker, MD  Cholecalciferol (VITAMIN D) 2000 UNITS CAPS Take 1 capsule (2,000 Units total) by mouth daily. 01/14/15   Adline Potter, MD  lisinopril (PRINIVIL,ZESTRIL) 10 MG tablet Take 1 tablet (10 mg total) by mouth 2 (two) times daily. 01/22/16   Juline Patch, MD  lisinopril (PRINIVIL,ZESTRIL) 20 MG tablet TAKE 1 TABLET (20 MG TOTAL) BY MOUTH DAILY. 02/18/16   Juline Patch, MD  Multiple Vitamins-Minerals (MULTIVITAMIN) tablet Take 1 tablet by mouth daily. 01/03/15   Adline Potter, MD    Family History Family History  Problem Relation Age of Onset  . Cancer Maternal Uncle   . Cancer Maternal Grandmother     Social History Social History  Substance Use Topics  . Smoking status: Never Smoker  . Smokeless tobacco: Not on file  . Alcohol use No     Allergies   Patient has no known allergies.   Review of Systems Review of Systems  Eyes: Negative for visual disturbance.  Respiratory: Negative for shortness of breath.   Cardiovascular: Negative for chest pain.  Gastrointestinal: Negative for abdominal pain.  Neurological: Positive for numbness and headaches. Negative for speech difficulty and weakness.  All other systems reviewed and are negative.    Physical Exam Updated Vital Signs BP 130/89   Pulse (!) 52   Temp 98.2 F (36.8 C) (Oral)   Resp 19  Ht 5\' 8"  (1.727 m)   Wt 255 lb (115.7 kg)   SpO2 94%   BMI 38.77 kg/m   Physical Exam  Constitutional: He is oriented to person, place, and time. He appears well-developed and well-nourished.  Overweight  HENT:  Head: Normocephalic and atraumatic.  Eyes: EOM are normal. Pupils are equal, round, and reactive to light.  Neck: Normal range of motion. Neck supple.  Cardiovascular: Normal rate, regular rhythm and normal heart sounds.   No murmur heard. Pulmonary/Chest: Effort normal and breath sounds  normal. No respiratory distress. He has no wheezes.  Abdominal: Soft. Bowel sounds are normal. There is no tenderness. There is no rebound.  Musculoskeletal: He exhibits no edema.  Neurological: He is alert and oriented to person, place, and time.  Cranial nerves II through XII intact, 5 out of 5 strength in all 4 extremities, no dysmetria to finger-nose-finger, sensation objectively intact  Skin: Skin is warm and dry.  Psychiatric: He has a normal mood and affect.  Nursing note and vitals reviewed.    ED Treatments / Results  DIAGNOSTIC STUDIES: Oxygen Saturation is 95% on RA, adequate by my interpretation.    COORDINATION OF CARE: 11:15 PM Discussed treatment plan with pt at bedside and pt agreed to plan.   Labs (all labs ordered are listed, but only abnormal results are displayed) Labs Reviewed  COMPREHENSIVE METABOLIC PANEL - Abnormal; Notable for the following:       Result Value   Glucose, Bld 101 (*)    AST 48 (*)    ALT 72 (*)    All other components within normal limits  CBC WITH DIFFERENTIAL/PLATELET    EKG  EKG Interpretation  Date/Time:  Sunday July 18 2016 22:12:04 EDT Ventricular Rate:  70 PR Interval:    QRS Duration: 102 QT Interval:  389 QTC Calculation: 420 R Axis:   -7 Text Interpretation:  Sinus rhythm Low voltage, precordial leads No old tracing to compare Confirmed by BELFI  MD, MELANIE ([id number]) on 07/18/2016 11:40:43 PM       Radiology Ct Head Wo Contrast  Result Date: 07/19/2016 CLINICAL DATA:  41 year old male with headache, and numbness of the right foot. EXAM: CT HEAD WITHOUT CONTRAST TECHNIQUE: Contiguous axial images were obtained from the base of the skull through the vertex without intravenous contrast. COMPARISON:  None. FINDINGS: Brain: No evidence of acute infarction, hemorrhage, hydrocephalus, extra-axial collection or mass lesion/mass effect. Vascular: No hyperdense vessel or unexpected calcification. Skull: Normal. Negative for  fracture or focal lesion. Sinuses/Orbits: No acute finding. Other: None. IMPRESSION: No acute intracranial pathology. Electronically Signed   By: Anner Crete M.D.   On: 07/19/2016 00:12    Procedures Procedures (including critical care time)  Medications Ordered in ED Medications  ketorolac (TORADOL) 30 MG/ML injection 30 mg (not administered)     Initial Impression / Assessment and Plan / ED Course  I have reviewed the triage vital signs and the nursing notes.  Pertinent labs & imaging results that were available during my care of the patient were reviewed by me and considered in my medical decision making (see chart for details).  Clinical Course as of Jul 19 116  Mon Jul 19, 2016  0115 Headache resolve, blood pressure improved, remains neurologically intact.  Discussed with patient the possibility of TIA. We'll place on aspirin daily until outpatient workup. ABCD 2 score is 3 duration of symptoms and blood pressure.  [CH]    Clinical Course User Index [CH] Barbette Hair  Kyler Germer, MD    Patient presents with hypertension, headache, and appears the edges. Reports paresthesias or mostly right-sided with the exception of bilateral hand numbness. This is all resolved. He is nonfocal on exam. Considerations include hypertensive urgency, TIA. Less likely subarachnoid hemorrhage given similar headache with hypertension in the past. Head CT and basic labwork obtained and is reassuring. Patient improved with Toradol and one dose of hydralazine. Blood pressure improved 130/89. Discussed with patient that he is low risk for stroke; however, given duration of symptoms and his blood pressure, he does need an outpatient workup. Will start on aspirin daily. He was given strict return precautions. Symptoms completely resolved at discharge.  After history, exam, and medical workup I feel the patient has been appropriately medically screened and is safe for discharge home. Pertinent diagnoses were  discussed with the patient. Patient was given return precautions.   Final Clinical Impressions(s) / ED Diagnoses   Final diagnoses:  Essential hypertension  Paresthesia  Acute non intractable tension-type headache    New Prescriptions New Prescriptions   ASPIRIN 81 MG CHEWABLE TABLET    Chew 4 tablets (324 mg total) by mouth daily.   I personally performed the services described in this documentation, which was scribed in my presence. The recorded information has been reviewed and is accurate.    Merryl Hacker, MD 07/19/16 0120

## 2016-07-19 ENCOUNTER — Other Ambulatory Visit: Payer: Self-pay

## 2016-07-19 MED ORDER — ASPIRIN 81 MG PO CHEW: 324.0000 mg | CHEWABLE_TABLET | Freq: Every day | ORAL | 0 refills | Status: DC

## 2016-07-19 NOTE — ED Notes (Signed)
Pt declines Toradol right now. Pain 2/10.

## 2016-07-19 NOTE — Discharge Instructions (Signed)
You were seen today for numbness, hypertension, and headache. Your initial workup is reassuring. Your fairly low risk for stroke however, a TIA is also consideration. You need to have an outpatient stroke workup. Take aspirin daily for preventative measures. Follow-up with her primary physician. If you have any new or worsening symptoms you need to be reevaluated immediately.

## 2016-07-21 ENCOUNTER — Encounter: Payer: Self-pay | Admitting: Family Medicine

## 2016-07-21 ENCOUNTER — Ambulatory Visit (INDEPENDENT_AMBULATORY_CARE_PROVIDER_SITE_OTHER): Payer: Commercial Managed Care - HMO | Admitting: Family Medicine

## 2016-07-21 VITALS — BP 120/98 | HR 80 | Ht 68.0 in | Wt 255.0 lb

## 2016-07-21 DIAGNOSIS — I10 Essential (primary) hypertension: Secondary | ICD-10-CM | POA: Diagnosis not present

## 2016-07-21 DIAGNOSIS — E785 Hyperlipidemia, unspecified: Secondary | ICD-10-CM

## 2016-07-21 DIAGNOSIS — Z8673 Personal history of transient ischemic attack (TIA), and cerebral infarction without residual deficits: Secondary | ICD-10-CM | POA: Diagnosis not present

## 2016-07-21 MED ORDER — HYDROCHLOROTHIAZIDE 12.5 MG PO CAPS: 12.5000 mg | ORAL_CAPSULE | Freq: Two times a day (BID) | ORAL | 1 refills | Status: DC

## 2016-07-21 NOTE — Progress Notes (Signed)
Name: Dennis Bryant   MRN: [id number]    DOB: 08-08-1975   Date:07/21/2016       Progress Note  Subjective  Chief Complaint  Chief Complaint  Patient presents with  . Follow-up    ER on 07/18/16- Dx with TIA- CT was good/ normal- started him on 4 81mg  aspirin daily. No issues since then    Hypertension  This is a new problem. The current episode started more than 1 year ago. The problem has been waxing and waning since onset. The problem is uncontrolled. Associated symptoms include headaches. Pertinent negatives include no anxiety, blurred vision, chest pain, malaise/fatigue, neck pain, orthopnea, palpitations, peripheral edema, PND, shortness of breath or sweats. Risk factors for coronary artery disease include obesity and male gender. Past treatments include ACE inhibitors. The current treatment provides mild improvement. There are no compliance problems.  There is no history of angina, kidney disease, CAD/MI, CVA, heart failure, left ventricular hypertrophy, PVD, renovascular disease or retinopathy. There is no history of chronic renal disease or a hypertension causing med.  Neurologic Problem  The patient's primary symptoms include focal sensory loss. The patient's pertinent negatives include no altered mental status, focal weakness, loss of balance, slurred speech or weakness. Primary symptoms comment: face/right hand/lateral foot. This is a new problem. The current episode started in the past 7 days. The neurological problem developed suddenly. The problem has been gradually improving since onset. There was right-sided, upper extremity, lower extremity and facial focality noted. Associated symptoms include headaches. Pertinent negatives include no abdominal pain, back pain, chest pain, dizziness, fever, nausea, neck pain, palpitations or shortness of breath. Past treatments include nothing. There is no history of a bleeding disorder or a CVA.    No problem-specific Assessment & Plan notes  found for this encounter.   Past Medical History:  Diagnosis Date  . Hypertension   . Vitamin D deficiency     No past surgical history on file.  Family History  Problem Relation Age of Onset  . Cancer Maternal Uncle   . Cancer Maternal Grandmother     Social History   Social History  . Marital status: Married    Spouse name: N/A  . Number of children: N/A  . Years of education: N/A   Occupational History  . Not on file.   Social History Main Topics  . Smoking status: Never Smoker  . Smokeless tobacco: Never Used  . Alcohol use No  . Drug use: No  . Sexual activity: Yes   Other Topics Concern  . Not on file   Social History Narrative  . No narrative on file    No Known Allergies  Outpatient Medications Prior to Visit  Medication Sig Dispense Refill  . aspirin 81 MG chewable tablet Chew 4 tablets (324 mg total) by mouth daily. 30 tablet 0  . Cholecalciferol (VITAMIN D) 2000 UNITS CAPS Take 1 capsule (2,000 Units total) by mouth daily. 30 capsule   . lisinopril (PRINIVIL,ZESTRIL) 10 MG tablet Take 1 tablet (10 mg total) by mouth 2 (two) times daily. 180 tablet 1  . Multiple Vitamins-Minerals (MULTIVITAMIN) tablet Take 1 tablet by mouth daily. 30 tablet 0  . lisinopril (PRINIVIL,ZESTRIL) 20 MG tablet TAKE 1 TABLET (20 MG TOTAL) BY MOUTH DAILY. 30 tablet 1   No facility-administered medications prior to visit.     Review of Systems  Constitutional: Negative for chills, fever, malaise/fatigue and weight loss.  HENT: Negative for ear discharge, ear pain and sore throat.  Eyes: Negative for blurred vision.  Respiratory: Negative for cough, sputum production, shortness of breath and wheezing.   Cardiovascular: Negative for chest pain, palpitations, orthopnea, leg swelling and PND.  Gastrointestinal: Negative for abdominal pain, blood in stool, constipation, diarrhea, heartburn, melena and nausea.  Genitourinary: Negative for dysuria, frequency, hematuria and  urgency.  Musculoskeletal: Negative for back pain, joint pain, myalgias and neck pain.  Skin: Negative for rash.  Neurological: Positive for headaches. Negative for dizziness, tingling, sensory change, speech change, focal weakness, seizures, weakness and loss of balance.  Endo/Heme/Allergies: Negative for environmental allergies and polydipsia. Does not bruise/bleed easily.  Psychiatric/Behavioral: Negative for depression and suicidal ideas. The patient is not nervous/anxious and does not have insomnia.      Objective  Vitals:   07/21/16 1029  BP: (!) 120/98  Pulse: 80  Weight: 255 lb (115.7 kg)  Height: 5\' 8"  (1.727 m)    Physical Exam  Constitutional: He is oriented to person, place, and time and well-developed, well-nourished, and in no distress.  HENT:  Head: Normocephalic.  Right Ear: External ear normal.  Left Ear: External ear normal.  Nose: Nose normal.  Mouth/Throat: Oropharynx is clear and moist.  Eyes: Conjunctivae and EOM are normal. Pupils are equal, round, and reactive to light. Right eye exhibits no discharge. Left eye exhibits no discharge. No scleral icterus.  Fundoscopic exam:      The right eye shows no arteriolar narrowing, no AV nicking, no hemorrhage and no papilledema.       The left eye shows no arteriolar narrowing, no AV nicking, no hemorrhage and no papilledema.  Neck: Normal range of motion. Neck supple. Normal carotid pulses and no JVD present. Carotid bruit is not present. No tracheal deviation present. No thyromegaly present.  Cardiovascular: Normal rate, regular rhythm, S1 normal, S2 normal, normal heart sounds and intact distal pulses.  Exam reveals no gallop, no S3, no S4 and no friction rub.   No murmur heard. Pulmonary/Chest: Breath sounds normal. No respiratory distress. He has no wheezes. He has no rales.  Abdominal: Soft. Bowel sounds are normal. He exhibits no mass. There is no hepatosplenomegaly. There is no tenderness. There is no rebound,  no guarding and no CVA tenderness.  Musculoskeletal: Normal range of motion. He exhibits no edema or tenderness.  Lymphadenopathy:    He has no cervical adenopathy.  Neurological: He is alert and oriented to person, place, and time. He has normal sensation, normal strength, normal reflexes and intact cranial nerves. No cranial nerve deficit.  Skin: Skin is warm. No rash noted.  Psychiatric: Mood and affect normal.  Nursing note and vitals reviewed.     Assessment & Plan  Problem List Items Addressed This Visit      Cardiovascular and Mediastinum   Essential hypertension - Primary   Relevant Medications   hydrochlorothiazide (MICROZIDE) 12.5 MG capsule     Other   Hyperlipidemia   Relevant Medications   hydrochlorothiazide (MICROZIDE) 12.5 MG capsule   Other Relevant Orders   Lipid Profile   History of transient ischemic attack (TIA)   Relevant Orders   US Carotid Duplex Bilateral   Ambulatory referral to Neurology      Meds ordered this encounter  Medications  . hydrochlorothiazide (MICROZIDE) 12.5 MG capsule    Sig: Take 1 capsule (12.5 mg total) by mouth 2 (two) times daily.    Dispense:  60 capsule    Refill:  1      Dr. Otilio Miu [place]  [facility]  07/21/16

## 2016-07-24 LAB — LIPID PANEL
Chol/HDL Ratio: 6.1 ratio units — ABNORMAL HIGH (ref 0.0–5.0)
Cholesterol, Total: 268 mg/dL — ABNORMAL HIGH (ref 100–199)
HDL: 44 mg/dL (ref >39)
LDL Calculated: 200 mg/dL — ABNORMAL HIGH (ref 0–99)
Triglycerides: 119 mg/dL (ref 0–149)
VLDL Cholesterol Cal: 24 mg/dL (ref 5–40)

## 2016-08-02 ENCOUNTER — Ambulatory Visit: Payer: Commercial Managed Care - HMO

## 2016-08-13 ENCOUNTER — Other Ambulatory Visit: Payer: Self-pay | Admitting: Family Medicine

## 2016-08-13 DIAGNOSIS — I10 Essential (primary) hypertension: Secondary | ICD-10-CM

## 2016-09-09 ENCOUNTER — Other Ambulatory Visit: Payer: Self-pay | Admitting: Family Medicine

## 2016-09-09 DIAGNOSIS — I10 Essential (primary) hypertension: Secondary | ICD-10-CM

## 2016-09-10 ENCOUNTER — Other Ambulatory Visit: Payer: Self-pay

## 2016-09-12 ENCOUNTER — Other Ambulatory Visit: Payer: Self-pay | Admitting: Family Medicine

## 2016-09-12 DIAGNOSIS — I10 Essential (primary) hypertension: Secondary | ICD-10-CM

## 2016-10-09 ENCOUNTER — Other Ambulatory Visit: Payer: Self-pay | Admitting: Family Medicine

## 2016-10-09 DIAGNOSIS — I10 Essential (primary) hypertension: Secondary | ICD-10-CM

## 2016-11-06 ENCOUNTER — Other Ambulatory Visit: Payer: Self-pay | Admitting: Family Medicine

## 2016-11-06 DIAGNOSIS — I10 Essential (primary) hypertension: Secondary | ICD-10-CM

## 2016-11-07 ENCOUNTER — Other Ambulatory Visit: Payer: Self-pay | Admitting: Family Medicine

## 2016-11-07 DIAGNOSIS — I10 Essential (primary) hypertension: Secondary | ICD-10-CM

## 2016-11-25 ENCOUNTER — Other Ambulatory Visit: Payer: Self-pay

## 2016-11-25 DIAGNOSIS — I10 Essential (primary) hypertension: Secondary | ICD-10-CM

## 2016-11-25 MED ORDER — LISINOPRIL 10 MG PO TABS: 10.0000 mg | ORAL_TABLET | Freq: Two times a day (BID) | ORAL | 1 refills | Status: DC

## 2016-12-16 ENCOUNTER — Other Ambulatory Visit: Payer: Self-pay

## 2016-12-16 DIAGNOSIS — I10 Essential (primary) hypertension: Secondary | ICD-10-CM

## 2016-12-16 MED ORDER — HYDROCHLOROTHIAZIDE 12.5 MG PO CAPS: 12.5000 mg | ORAL_CAPSULE | Freq: Two times a day (BID) | ORAL | 0 refills | Status: DC

## 2016-12-22 ENCOUNTER — Telehealth: Payer: Self-pay | Admitting: Family Medicine

## 2016-12-22 NOTE — Telephone Encounter (Signed)
PATIENT IS REQUESTING A REFILL ON-lisinopril (PRINIVIL,ZESTRIL) 10 MG tablet. PATIENT HAS APPT FOR September 7. PLEASE SEND TO [facility] IN [place].

## 2016-12-23 NOTE — Telephone Encounter (Signed)
Spoke to pt- should have refills left/ he is to call pharm and let me know

## 2016-12-30 ENCOUNTER — Other Ambulatory Visit: Payer: Self-pay

## 2017-01-12 ENCOUNTER — Other Ambulatory Visit: Payer: Self-pay | Admitting: Family Medicine

## 2017-01-12 DIAGNOSIS — I10 Essential (primary) hypertension: Secondary | ICD-10-CM

## 2017-01-14 ENCOUNTER — Ambulatory Visit (INDEPENDENT_AMBULATORY_CARE_PROVIDER_SITE_OTHER): Payer: 59 | Admitting: Family Medicine

## 2017-01-14 ENCOUNTER — Encounter: Payer: Self-pay | Admitting: Family Medicine

## 2017-01-14 VITALS — BP 120/70 | HR 68 | Ht 68.0 in | Wt 246.0 lb

## 2017-01-14 DIAGNOSIS — I10 Essential (primary) hypertension: Secondary | ICD-10-CM

## 2017-01-14 DIAGNOSIS — D171 Benign lipomatous neoplasm of skin and subcutaneous tissue of trunk: Secondary | ICD-10-CM | POA: Diagnosis not present

## 2017-01-14 MED ORDER — LISINOPRIL-HYDROCHLOROTHIAZIDE 20-25 MG PO TABS: 1.0000 | ORAL_TABLET | Freq: Every day | ORAL | 1 refills | Status: DC

## 2017-01-14 NOTE — Progress Notes (Signed)
Name: Dennis Bryant   MRN: [id number]    DOB: 1976/02/07   Date:01/14/2017       Progress Note  Subjective  Chief Complaint  Chief Complaint  Patient presents with  . Hypertension    Hypertension  This is a chronic problem. The current episode started more than 1 year ago. The problem is unchanged. The problem is controlled. Pertinent negatives include no anxiety, blurred vision, chest pain, headaches, malaise/fatigue, neck pain, orthopnea, palpitations, peripheral edema, PND, shortness of breath or sweats. There are no associated agents to hypertension. There are no known risk factors for coronary artery disease. Past treatments include ACE inhibitors and diuretics. The current treatment provides moderate improvement. There are no compliance problems.  There is no history of angina, kidney disease, CAD/MI, CVA, heart failure, left ventricular hypertrophy, PVD or retinopathy. There is no history of chronic renal disease, a hypertension causing med or renovascular disease.    No problem-specific Assessment & Plan notes found for this encounter.   Past Medical History:  Diagnosis Date  . Hypertension   . Vitamin D deficiency     No past surgical history on file.  Family History  Problem Relation Age of Onset  . Cancer Maternal Uncle   . Cancer Maternal Grandmother     Social History   Social History  . Marital status: Married    Spouse name: N/A  . Number of children: N/A  . Years of education: N/A   Occupational History  . Not on file.   Social History Main Topics  . Smoking status: Never Smoker  . Smokeless tobacco: Never Used  . Alcohol use No  . Drug use: No  . Sexual activity: Yes   Other Topics Concern  . Not on file   Social History Narrative  . No narrative on file    No Known Allergies  Outpatient Medications Prior to Visit  Medication Sig Dispense Refill  . aspirin 81 MG chewable tablet Chew 4 tablets (324 mg total) by mouth daily. (Patient taking  differently: Chew 162 mg by mouth daily. ) 30 tablet 0  . Cholecalciferol (VITAMIN D) 2000 UNITS CAPS Take 1 capsule (2,000 Units total) by mouth daily. 30 capsule   . Multiple Vitamins-Minerals (MULTIVITAMIN) tablet Take 1 tablet by mouth daily. 30 tablet 0  . hydrochlorothiazide (MICROZIDE) 12.5 MG capsule TAKE 1 CAPSULE (12.5 MG TOTAL) BY MOUTH 2 (TWO) TIMES DAILY. 60 capsule 0  . lisinopril (PRINIVIL,ZESTRIL) 10 MG tablet Take 1 tablet (10 mg total) by mouth 2 (two) times daily. 60 tablet 1   No facility-administered medications prior to visit.     Review of Systems  Constitutional: Negative for chills, fever, malaise/fatigue and weight loss.  HENT: Negative for ear discharge, ear pain and sore throat.   Eyes: Negative for blurred vision.  Respiratory: Negative for cough, sputum production, shortness of breath and wheezing.   Cardiovascular: Negative for chest pain, palpitations, orthopnea, leg swelling and PND.  Gastrointestinal: Negative for abdominal pain, blood in stool, constipation, diarrhea, heartburn, melena and nausea.  Genitourinary: Negative for dysuria, frequency, hematuria and urgency.  Musculoskeletal: Negative for back pain, joint pain, myalgias and neck pain.  Skin: Negative for rash.  Neurological: Negative for dizziness, tingling, sensory change, focal weakness and headaches.  Endo/Heme/Allergies: Negative for environmental allergies and polydipsia. Does not bruise/bleed easily.  Psychiatric/Behavioral: Negative for depression and suicidal ideas. The patient is not nervous/anxious and does not have insomnia.      Objective  Vitals:  01/14/17 0915  BP: 120/70  Pulse: 68  Weight: 246 lb (111.6 kg)  Height: 5\' 8"  (1.727 m)    Physical Exam  Constitutional: He is oriented to person, place, and time and well-developed, well-nourished, and in no distress.  HENT:  Head: Normocephalic.  Right Ear: External ear normal.  Left Ear: External ear normal.  Nose: Nose  normal.  Mouth/Throat: Oropharynx is clear and moist.  Eyes: Pupils are equal, round, and reactive to light. Conjunctivae and EOM are normal. Right eye exhibits no discharge. Left eye exhibits no discharge. No scleral icterus.  Neck: Normal range of motion. Neck supple. No JVD present. No tracheal deviation present. No thyromegaly present.  Cardiovascular: Normal rate, regular rhythm, normal heart sounds and intact distal pulses.  Exam reveals no gallop and no friction rub.   No murmur heard. Pulmonary/Chest: Breath sounds normal. No respiratory distress. He has no wheezes. He has no rales.  Abdominal: Soft. Bowel sounds are normal. He exhibits no mass. There is no hepatosplenomegaly. There is no tenderness. There is no rebound, no guarding and no CVA tenderness.  Musculoskeletal: Normal range of motion. He exhibits no edema or tenderness.  Lymphadenopathy:    He has no cervical adenopathy.  Neurological: He is alert and oriented to person, place, and time. He has normal sensation, normal strength and intact cranial nerves. No cranial nerve deficit.  Skin: Skin is warm. No rash noted.  Psychiatric: Mood and affect normal.  Nursing note and vitals reviewed.     Assessment & Plan  Problem List Items Addressed This Visit      Cardiovascular and Mediastinum   Essential hypertension - Primary   Relevant Medications   lisinopril-hydrochlorothiazide (PRINZIDE,ZESTORETIC) 20-25 MG tablet    Other Visit Diagnoses    Lipoma of torso       Relevant Orders   Ambulatory referral to General Surgery      Meds ordered this encounter  Medications  . lisinopril-hydrochlorothiazide (PRINZIDE,ZESTORETIC) 20-25 MG tablet    Sig: Take 1 tablet by mouth daily.    Dispense:  90 tablet    Refill:  1      Dr. Otilio Miu [facility]  01/14/17

## 2017-01-28 ENCOUNTER — Ambulatory Visit: Payer: Self-pay | Admitting: Surgery

## 2017-01-28 ENCOUNTER — Encounter (HOSPITAL_BASED_OUTPATIENT_CLINIC_OR_DEPARTMENT_OTHER): Payer: Self-pay | Admitting: *Deleted

## 2017-01-28 NOTE — H&P (Signed)
Dennis Bryant 01/28/2017 9:40 AM Location: [facility] Patient #: [id number] DOB: 1975-06-09 Undefined / Language: Dennis Bryant / Race: White Male  History of Present Illness Dennis Bryant A. Regie Bunner MD; 01/28/2017 10:58 AM) Patient words: Patient presents for consultation request of Dr. Ronnald Bryant for multiple lipoma. He has a history of multiple lipoma. He has 2 on his right mid back and a third in his right upper quadrant of his abdomen that are causing discomfort especially sleeps. The areas are stable in size and causing more pain due to compression. Patient denies any radiation of pain. He has no drainage or redness from other areas.  The patient is a 41 year old male.   Past Surgical History Dennis Bryant, [place]; 01/28/2017 9:40 AM) Oral Surgery  Diagnostic Studies History Dennis Bryant, [place]; 01/28/2017 9:40 AM) Colonoscopy never  Allergies Dennis Bryant, RMA; 01/28/2017 9:40 AM) No Known Allergies 01/28/2017  Medication History Dennis Bryant, RMA; 01/28/2017 9:41 AM) Lisinopril-Hydrochlorothiazide (10-12.5MG  Tablet, Oral) Active. Aspirin (81MG  Tablet, Oral) Active. Multivitamins (Oral) Active. Medications Reconciled  Social History Dennis Bryant, [place]; 01/28/2017 9:40 AM) Caffeine use Carbonated beverages. No alcohol use No drug use Tobacco use Never smoker.  Family History Dennis Bryant, [place]; 01/28/2017 9:40 AM) Family history unknown First Degree Relatives  Other Problems Dennis Bryant, [place]; 01/28/2017 9:40 AM) High blood pressure     Review of Systems Dennis Bryant RMA; 01/28/2017 9:40 AM) General Not Present- Appetite Loss, Chills, Fatigue, Fever, Night Sweats, Weight Gain and Weight Loss. Skin Not Present- Change in Wart/Mole, Dryness, Hives, Jaundice, New Lesions, Non-Healing Wounds, Rash and Ulcer. HEENT Not Present- Earache, Hearing Loss, Hoarseness, Nose Bleed, Oral Ulcers, Ringing in the Ears, Seasonal Allergies, Sinus  Pain, Sore Throat, Visual Disturbances, Wears glasses/contact lenses and Yellow Eyes. Respiratory Not Present- Bloody sputum, Chronic Cough, Difficulty Breathing, Snoring and Wheezing. Breast Not Present- Breast Mass, Breast Pain, Nipple Discharge and Skin Changes. Cardiovascular Not Present- Chest Pain, Difficulty Breathing Lying Down, Leg Cramps, Palpitations, Rapid Heart Rate, Shortness of Breath and Swelling of Extremities. Gastrointestinal Not Present- Abdominal Pain, Bloating, Bloody Stool, Change in Bowel Habits, Chronic diarrhea, Constipation, Difficulty Swallowing, Excessive gas, Gets full quickly at meals, Hemorrhoids, Indigestion, Nausea, Rectal Pain and Vomiting. Male Genitourinary Not Present- Blood in Urine, Change in Urinary Stream, Frequency, Impotence, Nocturia, Painful Urination, Urgency and Urine Leakage. Musculoskeletal Not Present- Back Pain, Joint Pain, Joint Stiffness, Muscle Pain, Muscle Weakness and Swelling of Extremities. Neurological Not Present- Decreased Memory, Fainting, Headaches, Numbness, Seizures, Tingling, Tremor, Trouble walking and Weakness. Psychiatric Not Present- Anxiety, Bipolar, Change in Sleep Pattern, Depression, Fearful and Frequent crying. Endocrine Not Present- Cold Intolerance, Excessive Hunger, Hair Changes, Heat Intolerance, Hot flashes and New Diabetes.  Vitals Dennis Bryant RMA; 01/28/2017 9:41 AM) 01/28/2017 9:41 AM Weight: 251.2 lb Height: 68in Body Surface Area: 2.25 m Body Mass Index: 38.19 kg/m  Temp.: 98.18F  Pulse: 75 (Regular)  BP: 130/94 (Sitting, Left Arm, Standard)      Physical Exam (Dennis Bryant A. Lillee Mooneyhan MD; 01/28/2017 10:58 AM)  General Mental Status-Alert. General Appearance-Consistent with stated age. Hydration-Well hydrated. Voice-Normal.  Integumentary Note: Right mid back or 2 masses that are in the subcutaneous fat which are mobile. They are just the right of midline. One measures 5 cm the  other is about 3 cm in its adjacent to the first mass. It is right upper quadrants a 5 x 5 cm mobile mass consistent with lipoma.  Head and Neck Head-normocephalic, atraumatic with no lesions or palpable masses. Trachea-midline. Thyroid Gland Characteristics -  normal size and consistency.  Eye Eyeball - Bilateral-Extraocular movements intact. Sclera/Conjunctiva - Bilateral-No scleral icterus.  Chest and Lung Exam Chest and lung exam reveals -quiet, even and easy respiratory effort with no use of accessory muscles and on auscultation, normal breath sounds, no adventitious sounds and normal vocal resonance. Inspection Chest Wall - Normal. Back - normal.  Cardiovascular Cardiovascular examination reveals -normal heart sounds, regular rate and rhythm with no murmurs and normal pedal pulses bilaterally.  Neurologic Neurologic evaluation reveals -alert and oriented x 3 with no impairment of recent or remote memory. Mental Status-Normal.  Musculoskeletal Normal Exam - Left-Upper Extremity Strength Normal and Lower Extremity Strength Normal. Normal Exam - Right-Upper Extremity Strength Normal and Lower Extremity Strength Normal.    Assessment & Plan (Kiyana Vazguez A. Hao Dion MD; 01/28/2017 10:59 AM)  LIPOMA OF BACK (D17.1) Impression: Patient desires excision. Risk of bleeding, infection, nerve injury, blood vessel injury, anesthesia risk, blood clots, stroke, complications from the above report for further care and/or therapy. Other palpitations or wound infection, cosmetic deformity, and wound care or his packing discussed.  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

## 2017-02-09 ENCOUNTER — Other Ambulatory Visit: Payer: Self-pay | Admitting: Family Medicine

## 2017-02-09 DIAGNOSIS — I10 Essential (primary) hypertension: Secondary | ICD-10-CM

## 2017-02-14 ENCOUNTER — Encounter (HOSPITAL_BASED_OUTPATIENT_CLINIC_OR_DEPARTMENT_OTHER): Payer: Self-pay | Admitting: *Deleted

## 2017-02-22 ENCOUNTER — Ambulatory Visit (HOSPITAL_BASED_OUTPATIENT_CLINIC_OR_DEPARTMENT_OTHER): Payer: 59 | Admitting: Anesthesiology

## 2017-02-22 ENCOUNTER — Encounter (HOSPITAL_BASED_OUTPATIENT_CLINIC_OR_DEPARTMENT_OTHER)
Admission: RE | Disposition: A | Discharge status: Home / Self Care | Payer: Self-pay | Source: Ambulatory Visit | Attending: Surgery

## 2017-02-22 ENCOUNTER — Encounter (HOSPITAL_BASED_OUTPATIENT_CLINIC_OR_DEPARTMENT_OTHER): Payer: Self-pay

## 2017-02-22 ENCOUNTER — Ambulatory Visit (HOSPITAL_BASED_OUTPATIENT_CLINIC_OR_DEPARTMENT_OTHER)
Admission: RE | Admit: 2017-02-22 | Discharge: 2017-02-22 | Disposition: A | Discharge status: Home / Self Care | Payer: 59 | Source: Ambulatory Visit | Attending: Surgery | Admitting: Surgery

## 2017-02-22 DIAGNOSIS — Z79899 Other long term (current) drug therapy: Secondary | ICD-10-CM | POA: Insufficient documentation

## 2017-02-22 DIAGNOSIS — D171 Benign lipomatous neoplasm of skin and subcutaneous tissue of trunk: Secondary | ICD-10-CM | POA: Insufficient documentation

## 2017-02-22 DIAGNOSIS — Z8673 Personal history of transient ischemic attack (TIA), and cerebral infarction without residual deficits: Secondary | ICD-10-CM | POA: Diagnosis not present

## 2017-02-22 DIAGNOSIS — Z7982 Long term (current) use of aspirin: Secondary | ICD-10-CM | POA: Diagnosis not present

## 2017-02-22 DIAGNOSIS — I10 Essential (primary) hypertension: Secondary | ICD-10-CM | POA: Insufficient documentation

## 2017-02-22 DIAGNOSIS — Z6837 Body mass index (BMI) 37.0-37.9, adult: Secondary | ICD-10-CM | POA: Diagnosis not present

## 2017-02-22 HISTORY — PX: LIPOMA EXCISION: SHX5283

## 2017-02-22 HISTORY — DX: Benign lipomatous neoplasm, unspecified: D17.9

## 2017-02-22 SURGERY — EXCISION LIPOMA
Anesthesia: General | Site: Back

## 2017-02-22 MED ORDER — BUPIVACAINE-EPINEPHRINE 0.25% -1:200000 IJ SOLN
INTRAMUSCULAR | Status: DC | PRN
  Administered 2017-02-22: 30 mL

## 2017-02-22 MED ORDER — MIDAZOLAM HCL 2 MG/2ML IJ SOLN
INTRAMUSCULAR | Status: AC
  Filled 2017-02-22: qty 2

## 2017-02-22 MED ORDER — CHLORHEXIDINE GLUCONATE CLOTH 2 % EX PADS: 6.0000 | MEDICATED_PAD | Freq: Once | CUTANEOUS | Status: DC

## 2017-02-22 MED ORDER — PROPOFOL 10 MG/ML IV BOLUS
INTRAVENOUS | Status: AC
  Filled 2017-02-22: qty 20

## 2017-02-22 MED ORDER — EPHEDRINE SULFATE 50 MG/ML IJ SOLN
INTRAMUSCULAR | Status: DC | PRN
  Administered 2017-02-22: 10 mg via INTRAVENOUS
  Administered 2017-02-22: 5 mg via INTRAVENOUS

## 2017-02-22 MED ORDER — DEXTROSE 5 % IV SOLN
3.0000 g | INTRAVENOUS | Status: AC
  Administered 2017-02-22: 3 g via INTRAVENOUS

## 2017-02-22 MED ORDER — GLYCOPYRROLATE 0.2 MG/ML IJ SOLN
INTRAMUSCULAR | Status: DC | PRN
  Administered 2017-02-22: .8 mg via INTRAVENOUS

## 2017-02-22 MED ORDER — GABAPENTIN 300 MG PO CAPS
ORAL_CAPSULE | ORAL | Status: AC
  Filled 2017-02-22: qty 1

## 2017-02-22 MED ORDER — GABAPENTIN 300 MG PO CAPS
300.0000 mg | ORAL_CAPSULE | ORAL | Status: AC
  Administered 2017-02-22: 300 mg via ORAL

## 2017-02-22 MED ORDER — MIDAZOLAM HCL 2 MG/2ML IJ SOLN
1.0000 mg | INTRAMUSCULAR | Status: DC | PRN
  Administered 2017-02-22: 2 mg via INTRAVENOUS

## 2017-02-22 MED ORDER — LACTATED RINGERS IV SOLN
INTRAVENOUS | Status: DC
  Administered 2017-02-22 (×2): via INTRAVENOUS

## 2017-02-22 MED ORDER — DEXAMETHASONE SODIUM PHOSPHATE 10 MG/ML IJ SOLN
INTRAMUSCULAR | Status: AC
  Filled 2017-02-22: qty 1

## 2017-02-22 MED ORDER — HYDROCODONE-ACETAMINOPHEN 5-325 MG PO TABS: 1.0000 | ORAL_TABLET | Freq: Four times a day (QID) | ORAL | 0 refills | Status: DC | PRN

## 2017-02-22 MED ORDER — LIDOCAINE HCL (CARDIAC) 20 MG/ML IV SOLN
INTRAVENOUS | Status: DC | PRN
  Administered 2017-02-22: 100 mg via INTRAVENOUS

## 2017-02-22 MED ORDER — ROCURONIUM BROMIDE 100 MG/10ML IV SOLN
INTRAVENOUS | Status: DC | PRN
  Administered 2017-02-22: 60 mg via INTRAVENOUS

## 2017-02-22 MED ORDER — FENTANYL CITRATE (PF) 100 MCG/2ML IJ SOLN
50.0000 ug | INTRAMUSCULAR | Status: AC | PRN
  Administered 2017-02-22: 50 ug via INTRAVENOUS
  Administered 2017-02-22: 100 ug via INTRAVENOUS
  Administered 2017-02-22 (×2): 25 ug via INTRAVENOUS

## 2017-02-22 MED ORDER — ONDANSETRON HCL 4 MG/2ML IJ SOLN
INTRAMUSCULAR | Status: DC | PRN
  Administered 2017-02-22: 4 mg via INTRAVENOUS

## 2017-02-22 MED ORDER — DEXAMETHASONE SODIUM PHOSPHATE 4 MG/ML IJ SOLN
INTRAMUSCULAR | Status: DC | PRN
  Administered 2017-02-22: 10 mg via INTRAVENOUS

## 2017-02-22 MED ORDER — 0.9 % SODIUM CHLORIDE (POUR BTL) OPTIME
TOPICAL | Status: DC | PRN
  Administered 2017-02-22: 1000 mL

## 2017-02-22 MED ORDER — SCOPOLAMINE 1 MG/3DAYS TD PT72: 1.0000 | MEDICATED_PATCH | Freq: Once | TRANSDERMAL | Status: DC | PRN

## 2017-02-22 MED ORDER — NEOSTIGMINE METHYLSULFATE 10 MG/10ML IV SOLN
INTRAVENOUS | Status: DC | PRN
  Administered 2017-02-22: 4 mg via INTRAVENOUS

## 2017-02-22 MED ORDER — PROPOFOL 10 MG/ML IV BOLUS
INTRAVENOUS | Status: DC | PRN
  Administered 2017-02-22: 250 mg via INTRAVENOUS

## 2017-02-22 MED ORDER — CEFAZOLIN SODIUM-DEXTROSE 1-4 GM/50ML-% IV SOLN
INTRAVENOUS | Status: AC
  Filled 2017-02-22: qty 50

## 2017-02-22 MED ORDER — FENTANYL CITRATE (PF) 100 MCG/2ML IJ SOLN
INTRAMUSCULAR | Status: AC
  Filled 2017-02-22: qty 2

## 2017-02-22 MED ORDER — ONDANSETRON HCL 4 MG/2ML IJ SOLN
INTRAMUSCULAR | Status: AC
  Filled 2017-02-22: qty 2

## 2017-02-22 MED ORDER — ACETAMINOPHEN 500 MG PO TABS
ORAL_TABLET | ORAL | Status: AC
  Filled 2017-02-22: qty 2

## 2017-02-22 MED ORDER — ACETAMINOPHEN 500 MG PO TABS
1000.0000 mg | ORAL_TABLET | ORAL | Status: AC
  Administered 2017-02-22: 1000 mg via ORAL

## 2017-02-22 MED ORDER — PHENYLEPHRINE HCL 10 MG/ML IJ SOLN
INTRAMUSCULAR | Status: DC | PRN
  Administered 2017-02-22 (×2): 120 ug via INTRAVENOUS
  Administered 2017-02-22: 80 ug via INTRAVENOUS
  Administered 2017-02-22 (×2): 120 ug via INTRAVENOUS

## 2017-02-22 MED ORDER — CEFAZOLIN SODIUM-DEXTROSE 2-4 GM/100ML-% IV SOLN
INTRAVENOUS | Status: AC
  Filled 2017-02-22: qty 100

## 2017-02-22 MED ORDER — LIDOCAINE 2% (20 MG/ML) 5 ML SYRINGE
INTRAMUSCULAR | Status: AC
  Filled 2017-02-22: qty 5

## 2017-02-22 SURGICAL SUPPLY — 33 items
BLADE SURG 15 STRL LF DISP TIS (BLADE) ×1 IMPLANT
BLADE SURG 15 STRL SS (BLADE) ×2
CANISTER SUCT 1200ML W/VALVE (MISCELLANEOUS) ×3 IMPLANT
CHLORAPREP W/TINT 26ML (MISCELLANEOUS) ×3 IMPLANT
COVER BACK TABLE 60X90IN (DRAPES) ×3 IMPLANT
COVER MAYO STAND STRL (DRAPES) ×3 IMPLANT
DERMABOND ADVANCED (GAUZE/BANDAGES/DRESSINGS) ×4
DERMABOND ADVANCED .7 DNX12 (GAUZE/BANDAGES/DRESSINGS) ×2 IMPLANT
DRAPE U-SHAPE 76X120 STRL (DRAPES) ×6 IMPLANT
DRAPE UTILITY XL STRL (DRAPES) ×3 IMPLANT
ELECT COATED BLADE 2.86 ST (ELECTRODE) ×3 IMPLANT
ELECT REM PT RETURN 9FT ADLT (ELECTROSURGICAL) ×3
ELECTRODE REM PT RTRN 9FT ADLT (ELECTROSURGICAL) ×1 IMPLANT
GLOVE BIOGEL PI IND STRL 8 (GLOVE) ×1 IMPLANT
GLOVE BIOGEL PI INDICATOR 8 (GLOVE) ×2
GLOVE ECLIPSE 8.0 STRL XLNG CF (GLOVE) ×3 IMPLANT
GOWN STRL REUS W/ TWL LRG LVL3 (GOWN DISPOSABLE) ×2 IMPLANT
GOWN STRL REUS W/TWL LRG LVL3 (GOWN DISPOSABLE) ×4
NEEDLE HYPO 25X1 1.5 SAFETY (NEEDLE) ×3 IMPLANT
NS IRRIG 1000ML POUR BTL (IV SOLUTION) ×3 IMPLANT
PACK BASIN DAY SURGERY FS (CUSTOM PROCEDURE TRAY) ×3 IMPLANT
PENCIL BUTTON HOLSTER BLD 10FT (ELECTRODE) ×3 IMPLANT
SLEEVE SCD COMPRESS KNEE MED (MISCELLANEOUS) ×3 IMPLANT
SPONGE LAP 4X18 X RAY DECT (DISPOSABLE) ×3 IMPLANT
SUT MON AB 4-0 PC3 18 (SUTURE) ×9 IMPLANT
SUT VICRYL 3-0 CR8 SH (SUTURE) ×3 IMPLANT
SUT VICRYL AB 3 0 TIES (SUTURE) IMPLANT
SYR CONTROL 10ML LL (SYRINGE) ×3 IMPLANT
TOWEL OR 17X24 6PK STRL BLUE (TOWEL DISPOSABLE) ×6 IMPLANT
TOWEL OR NON WOVEN STRL DISP B (DISPOSABLE) ×3 IMPLANT
TUBE CONNECTING 20'X1/4 (TUBING) ×1
TUBE CONNECTING 20X1/4 (TUBING) ×2 IMPLANT
YANKAUER SUCT BULB TIP NO VENT (SUCTIONS) ×3 IMPLANT

## 2017-02-22 NOTE — Anesthesia Preprocedure Evaluation (Signed)
Anesthesia Evaluation  Patient identified by MRN, date of birth, ID band Patient awake    Reviewed: Allergy & Precautions, H&P , Patient's Chart, lab work & pertinent test results, reviewed documented beta blocker date and time   Airway Mallampati: II  TM Distance: >3 FB Neck ROM: full    Dental no notable dental hx.    Pulmonary    Pulmonary exam normal breath sounds clear to auscultation       Cardiovascular hypertension,  Rhythm:regular Rate:Normal     Neuro/Psych TIA   GI/Hepatic   Endo/Other  Morbid obesity  Renal/GU      Musculoskeletal   Abdominal   Peds  Hematology   Anesthesia Other Findings   Reproductive/Obstetrics                             Anesthesia Physical Anesthesia Plan  ASA: III  Anesthesia Plan: General   Post-op Pain Management:    Induction: Intravenous  PONV Risk Score and Plan: 2 and Ondansetron, Dexamethasone and Treatment may vary due to age or medical condition  Airway Management Planned: Oral ETT  Additional Equipment:   Intra-op Plan:   Post-operative Plan: Extubation in OR  Informed Consent: I have reviewed the patients History and Physical, chart, labs and discussed the procedure including the risks, benefits and alternatives for the proposed anesthesia with the patient or authorized representative who has indicated his/her understanding and acceptance.   Dental Advisory Given  Plan Discussed with: CRNA and Surgeon  Anesthesia Plan Comments: (  )        Anesthesia Quick Evaluation

## 2017-02-22 NOTE — Transfer of Care (Signed)
Immediate Anesthesia Transfer of Care Note  Patient: Dennis Bryant  Procedure(s) Performed: EXCISION OF 2 BACK LIPOMAS  AND 1 ABDOMEN LIPOMA (N/A Back)  Patient Location: PACU  Anesthesia Type:General  Level of Consciousness: awake, alert  and oriented  Airway & Oxygen Therapy: Patient Spontanous Breathing and Patient connected to face mask oxygen  Post-op Assessment: Report given to RN and Post -op Vital signs reviewed and stable  Post vital signs: Reviewed and stable  Last Vitals:  Vitals:   02/22/17 0801  BP: 132/87  Pulse: 61  Resp: 18  Temp: (!) 36.4 C  SpO2: 97%    Last Pain:  Vitals:   02/22/17 0801  TempSrc: Oral         Complications: No apparent anesthesia complications

## 2017-02-22 NOTE — Anesthesia Postprocedure Evaluation (Signed)
Anesthesia Post Note  Patient: Moshe Cipro  Procedure(s) Performed: EXCISION OF 2 BACK LIPOMAS  AND 1 ABDOMEN LIPOMA (N/A Back)     Patient location during evaluation: PACU Anesthesia Type: General Level of consciousness: sedated Pain management: pain level controlled Vital Signs Assessment: post-procedure vital signs reviewed and stable Respiratory status: spontaneous breathing and respiratory function stable Cardiovascular status: stable Postop Assessment: no apparent nausea or vomiting Anesthetic complications: no    Last Vitals:  Vitals:   02/22/17 1045 02/22/17 1122  BP: 119/82 120/88  Pulse: 65 (!) 56  Resp: 17 16  Temp:  36.4 C  SpO2: 94% 96%    Last Pain:  Vitals:   02/22/17 1122  TempSrc: Oral  PainSc:                  Owyn Raulston DANIEL

## 2017-02-22 NOTE — Anesthesia Procedure Notes (Signed)
Procedure Name: Intubation Performed by: Verita Lamb Pre-anesthesia Checklist: Patient identified, Emergency Drugs available, Suction available, Patient being monitored and Timeout performed Patient Re-evaluated:Patient Re-evaluated prior to induction Preoxygenation: Pre-oxygenation with 100% oxygen Induction Type: IV induction Ventilation: Mask ventilation without difficulty Laryngoscope Size: Mac and 3 Grade View: Grade I Tube type: Oral Tube size: 7.0 mm Number of attempts: 1 Airway Equipment and Method: Stylet and Oral airway Placement Confirmation: ETT inserted through vocal cords under direct vision,  positive ETCO2,  CO2 detector and breath sounds checked- equal and bilateral Secured at: 21 cm Tube secured with: Tape Dental Injury: Teeth and Oropharynx as per pre-operative assessment

## 2017-02-22 NOTE — H&P (Signed)
Dennis Bryant 01/28/2017 9:40 AM Location: [facility] Patient #: [id number] DOB: 1976-02-21 Undefined / Language: Dennis Bryant / Race: White Male  History of Present Illness Dennis Moores A. [name] MD; 01/28/2017 10:58 AM) Patient words: Patient presents for consultation request of Dr. Ronnald Bryant for multiple lipoma. He has a history of multiple lipoma. He has 2 on his right mid back and a third in his right upper quadrant of his abdomen that are causing discomfort especially sleeps. The areas are stable in size and causing more pain due to compression. Patient denies any radiation of pain. He has no drainage or redness from other areas.  The patient is a 41 year old male.   Past Surgical History Dennis Bryant; 01/28/2017 9:40 AM) Oral Surgery  Diagnostic Studies History Dennis Bryant; 01/28/2017 9:40 AM) Colonoscopy never  Allergies Dennis Moan, RMA; 01/28/2017 9:40 AM) No Known Allergies 01/28/2017  Medication History Dennis Moan, RMA; 01/28/2017 9:41 AM) Lisinopril-Hydrochlorothiazide (10-12.5MG  Tablet, Oral) Active. Aspirin (81MG  Tablet, Oral) Active. Multivitamins (Oral) Active. Medications Reconciled  Social History Dennis Bryant; 01/28/2017 9:40 AM) Caffeine use Carbonated beverages. No alcohol use No drug use Tobacco use Never smoker.  Family History Dennis Bryant; 01/28/2017 9:40 AM) Family history unknown First Degree Relatives  Other Problems Dennis Bryant; 01/28/2017 9:40 AM) High blood pressure     Review of Systems Dennis Moan RMA; 01/28/2017 9:40 AM) General Not Present- Appetite Loss, Chills, Fatigue, Fever, Night Sweats, Weight Gain and Weight Loss. Skin Not Present- Change in Wart/Mole, Dryness, Hives, Jaundice, New Lesions, Non-Healing Wounds, Rash and Ulcer. HEENT Not Present- Earache, Hearing Loss, Hoarseness, Nose Bleed, Oral Ulcers, Ringing in the Ears, Seasonal  Allergies, Sinus Pain, Sore Throat, Visual Disturbances, Wears glasses/contact lenses and Yellow Eyes. Respiratory Not Present- Bloody sputum, Chronic Cough, Difficulty Breathing, Snoring and Wheezing. Breast Not Present- Breast Mass, Breast Pain, Nipple Discharge and Skin Changes. Cardiovascular Not Present- Chest Pain, Difficulty Breathing Lying Down, Leg Cramps, Palpitations, Rapid Heart Rate, Shortness of Breath and Swelling of Extremities. Gastrointestinal Not Present- Abdominal Pain, Bloating, Bloody Stool, Change in Bowel Habits, Chronic diarrhea, Constipation, Difficulty Swallowing, Excessive gas, Gets full quickly at meals, Hemorrhoids, Indigestion, Nausea, Rectal Pain and Vomiting. Male Genitourinary Not Present- Blood in Urine, Change in Urinary Stream, Frequency, Impotence, Nocturia, Painful Urination, Urgency and Urine Leakage. Musculoskeletal Not Present- Back Pain, Joint Pain, Joint Stiffness, Muscle Pain, Muscle Weakness and Swelling of Extremities. Neurological Not Present- Decreased Memory, Fainting, Headaches, Numbness, Seizures, Tingling, Tremor, Trouble walking and Weakness. Psychiatric Not Present- Anxiety, Bipolar, Change in Sleep Pattern, Depression, Fearful and Frequent crying. Endocrine Not Present- Cold Intolerance, Excessive Hunger, Hair Changes, Heat Intolerance, Hot flashes and New Diabetes.  Vitals Dennis Moan RMA; 01/28/2017 9:41 AM) 01/28/2017 9:41 AM Weight: 251.2 lb Height: 68in Body Surface Area: 2.25 m Body Mass Index: 38.19 kg/m  Temp.: 98.36F  Pulse: 75 (Regular)  BP: 130/94 (Sitting, Left Arm, Standard)      Physical Exam (Dennis Bryant A. Kazmir Oki MD; 01/28/2017 10:58 AM)  General Mental Status-Alert. General Appearance-Consistent with stated age. Hydration-Well hydrated. Voice-Normal.  Integumentary Note: Right mid back or 2 masses that are in the subcutaneous fat which are mobile. They are just the right of midline.  One measures 5 cm the other is about 3 cm in its adjacent to the first mass. It is right upper quadrants a 5 x 5 cm mobile mass consistent with lipoma.  Head and Neck Head-normocephalic, atraumatic with no lesions or palpable masses. Trachea-midline. Thyroid Gland Characteristics -  normal size and consistency.  Eye Eyeball - Bilateral-Extraocular movements intact. Sclera/Conjunctiva - Bilateral-No scleral icterus.  Chest and Lung Exam Chest and lung exam reveals -quiet, even and easy respiratory effort with no use of accessory muscles and on auscultation, normal breath sounds, no adventitious sounds and normal vocal resonance. Inspection Chest Wall - Normal. Back - normal.  Cardiovascular Cardiovascular examination reveals -normal heart sounds, regular rate and rhythm with no murmurs and normal pedal pulses bilaterally.  Neurologic Neurologic evaluation reveals -alert and oriented x 3 with no impairment of recent or remote memory. Mental Status-Normal.  Musculoskeletal Normal Exam - Left-Upper Extremity Strength Normal and Lower Extremity Strength Normal. Normal Exam - Right-Upper Extremity Strength Normal and Lower Extremity Strength Normal.    Assessment & Plan (Dennis Bryant A. Maxime Beckner MD; 01/28/2017 10:59 AM)  LIPOMA OF BACK (D17.1) Impression: Patient desires excision. Risk of bleeding, infection, nerve injury, blood vessel injury, anesthesia risk, blood clots, stroke, complications from the above report for further care and/or therapy. Other palpitations or wound infection, cosmetic deformity, and wound care or his packing discussed.  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

## 2017-02-22 NOTE — Op Note (Signed)
PREOPERATIVE DIAGNOSIS: Lipoma abdomen upper quadrant subcutaneous 5 cm, lipoma right lower back medial 4 cm, lipoma right lower lateral back 3 cm  Postoperative diagnosis: Same  Procedure: Excision of lipoma subcutaneous 3  Surgeon: Erroll Luna M.D.  Anesthesia: LMA with local  EBL: 30 mL  Specimens: 3 lipoma as outlined above  Drains: None  Indications for procedure: The patient presents for excision of lipoma involving his right upper quadrant of his abdomen, and right lower back. There are 2 lesions in his right lower back one was medial and lateral on the right side. These are all causing pain he desired excision.The procedure has been discussed with the patient.  Alternative therapies have been discussed with the patient.  Operative risks include bleeding,  Infection,  Organ injury,  Nerve injury,  Blood vessel injury,  DVT,  Pulmonary embolism,  Death,  And possible reoperation.  Medical management risks include worsening of present situation.  The success of the procedure is 50 -90 % at treating patients symptoms.  The patient understands and agrees to proceed.   Description of procedure: The patient was met in the holding area. All 3 areas were marked with a marking pin. Questions are answered. The patient was taken back to the operating room. He was placed supine on the beanbag on the operating room table. After induction of general anesthesia, he was rolled up with his left side down and padded appropriately on the beanbag. All 3 areas were prepped and draped in a sterile fashion and timeout was done. He received preoperative antibiotics. Local anesthetic consisting of 0.25% Sensorcaine was injected around all 3 areas. The back lesions were done first. A transverse incision was made over the lateral most right lower back lesion. Dissection was carried into the subcutaneous fat and 4 cm lipoma was removed. This was closed with 3-0 Vicryl and 4-0 Monocryl. In a similar fashion the  right lower back medial lesion was excised. Transverse incision was used and dissection was carried down to a  3 cm lipoma and this was excised from the subcutaneous fat.this incision was closed with 3-0 Vicryl and 4-0 Monocryl. The abdominal lipoma was identified in the right upper quadrant in the subcutaneous fat. Local anesthetic was infiltrated around this. Transverse incision was made a 5 cm subcutaneous lipoma was excised. Hemostasis achieved with cautery. Cavity was closed with 3-0 Vicryl and 4 0  Monocryl. Dermabond applied to all incisions.all final counts are found to be correct. The patient was placed supine extubated taken to recovery in satisfactory condition.

## 2017-02-22 NOTE — H&P (View-Only) (Signed)
Dennis Bryant 01/28/2017 9:40 AM Location: [facility] Patient #: [id number] DOB: 1975-10-18 Undefined / Language: [name] / Race: White Male  History of Present Illness Dennis Moores A. Lashawnna Lambrecht Bryant; 01/28/2017 10:58 AM) Patient words: Patient presents for consultation request of Dr. Ronnald Bryant for multiple lipoma. He has a history of multiple lipoma. He has 2 on his right mid back and a third in his right upper quadrant of his abdomen that are causing discomfort especially sleeps. The areas are stable in size and causing more pain due to compression. Patient denies any radiation of pain. He has no drainage or redness from other areas.  The patient is a 41 year old male.   Past Surgical History Dennis Bryant; 01/28/2017 9:40 AM) Oral Surgery  Diagnostic Studies History Dennis Bryant; 01/28/2017 9:40 AM) Colonoscopy never  Allergies Dennis Moan, Dennis Bryant; 01/28/2017 9:40 AM) No Known Allergies 01/28/2017  Medication History Dennis Moan, Dennis Bryant; 01/28/2017 9:41 AM) Lisinopril-Hydrochlorothiazide (10-12.5MG  Tablet, Oral) Active. Aspirin (81MG  Tablet, Oral) Active. Multivitamins (Oral) Active. Medications Reconciled  Social History Dennis Bryant; 01/28/2017 9:40 AM) Caffeine use Carbonated beverages. No alcohol use No drug use Tobacco use Never smoker.  Family History Dennis Bryant; 01/28/2017 9:40 AM) Family history unknown First Degree Relatives  Other Problems Dennis Bryant; 01/28/2017 9:40 AM) High blood pressure     Review of Systems Dennis Moan Dennis Bryant; 01/28/2017 9:40 AM) General Not Present- Appetite Loss, Chills, Fatigue, Fever, Night Sweats, Weight Gain and Weight Loss. Skin Not Present- Change in Wart/Mole, Dryness, Hives, Jaundice, New Lesions, Non-Healing Wounds, Rash and Ulcer. HEENT Not Present- Earache, Hearing Loss, Hoarseness, Nose Bleed, Oral Ulcers, Ringing in the Ears, Seasonal Allergies, Sinus  Pain, Sore Throat, Visual Disturbances, Wears glasses/contact lenses and Yellow Eyes. Respiratory Not Present- Bloody sputum, Chronic Cough, Difficulty Breathing, Snoring and Wheezing. Breast Not Present- Breast Mass, Breast Pain, Nipple Discharge and Skin Changes. Cardiovascular Not Present- Chest Pain, Difficulty Breathing Lying Down, Leg Cramps, Palpitations, Rapid Heart Rate, Shortness of Breath and Swelling of Extremities. Gastrointestinal Not Present- Abdominal Pain, Bloating, Bloody Stool, Change in Bowel Habits, Chronic diarrhea, Constipation, Difficulty Swallowing, Excessive gas, Gets full quickly at meals, Hemorrhoids, Indigestion, Nausea, Rectal Pain and Vomiting. Male Genitourinary Not Present- Blood in Urine, Change in Urinary Stream, Frequency, Impotence, Nocturia, Painful Urination, Urgency and Urine Leakage. Musculoskeletal Not Present- Back Pain, Joint Pain, Joint Stiffness, Muscle Pain, Muscle Weakness and Swelling of Extremities. Neurological Not Present- Decreased Memory, Fainting, Headaches, Numbness, Seizures, Tingling, Tremor, Trouble walking and Weakness. Psychiatric Not Present- Anxiety, Bipolar, Change in Sleep Pattern, Depression, Fearful and Frequent crying. Endocrine Not Present- Cold Intolerance, Excessive Hunger, Hair Changes, Heat Intolerance, Hot flashes and New Diabetes.  Vitals Dennis Moan Dennis Bryant; 01/28/2017 9:41 AM) 01/28/2017 9:41 AM Weight: 251.2 lb Height: 68in Body Surface Area: 2.25 m Body Mass Index: 38.19 kg/m  Temp.: 98.57F  Pulse: 75 (Regular)  BP: 130/94 (Sitting, Left Arm, Standard)      Physical Exam (Dennis Bryant; 01/28/2017 10:58 AM)  General Mental Status-Alert. General Appearance-Consistent with stated age. Hydration-Well hydrated. Voice-Normal.  Integumentary Note: Right mid back or 2 masses that are in the subcutaneous fat which are mobile. They are just the right of midline. One measures 5 cm the  other is about 3 cm in its adjacent to the first mass. It is right upper quadrants a 5 x 5 cm mobile mass consistent with lipoma.  Head and Neck Head-normocephalic, atraumatic with no lesions or palpable masses. Trachea-midline. Thyroid Gland Characteristics -  normal size and consistency.  Eye Eyeball - Bilateral-Extraocular movements intact. Sclera/Conjunctiva - Bilateral-No scleral icterus.  Chest and Lung Exam Chest and lung exam reveals -quiet, even and easy respiratory effort with no use of accessory muscles and on auscultation, normal breath sounds, no adventitious sounds and normal vocal resonance. Inspection Chest Wall - Normal. Back - normal.  Cardiovascular Cardiovascular examination reveals -normal heart sounds, regular rate and rhythm with no murmurs and normal pedal pulses bilaterally.  Neurologic Neurologic evaluation reveals -alert and oriented x 3 with no impairment of recent or remote memory. Mental Status-Normal.  Musculoskeletal Normal Exam - Left-Upper Extremity Strength Normal and Lower Extremity Strength Normal. Normal Exam - Right-Upper Extremity Strength Normal and Lower Extremity Strength Normal.    Assessment & Plan (Dennis Bryant; 01/28/2017 10:59 AM)  LIPOMA OF BACK (D17.1) Impression: Patient desires excision. Risk of bleeding, infection, nerve injury, blood vessel injury, anesthesia risk, blood clots, stroke, complications from the above report for further care and/or therapy. Other palpitations or wound infection, cosmetic deformity, and wound care or his packing discussed.  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

## 2017-02-22 NOTE — Discharge Instructions (Signed)
GENERAL SURGERY: POST OP INSTRUCTIONS ° °###################################################################### ° °EAT °Gradually transition to a high fiber diet with a fiber supplement over the next few weeks after discharge.  Start with a pureed / full liquid diet (see below) ° °WALK °Walk an hour a day.  Control your pain to do that.   ° °CONTROL PAIN °Control pain so that you can walk, sleep, tolerate sneezing/coughing, go up/down stairs. ° °HAVE A BOWEL MOVEMENT DAILY °Keep your bowels regular to avoid problems.  OK to try a laxative to override constipation.  OK to use an antidairrheal to slow down diarrhea.  Call if not better after 2 tries ° °CALL IF YOU HAVE PROBLEMS/CONCERNS °Call if you are still struggling despite following these instructions. °Call if you have concerns not answered by these instructions ° °###################################################################### ° ° ° °1. DIET: Follow a light bland diet the first 24 hours after arrival home, such as soup, liquids, crackers, etc.  Be sure to include lots of fluids daily.  Avoid fast food or heavy meals as your are more likely to get nauseated.   °2. Take your usually prescribed home medications unless otherwise directed. °3. PAIN CONTROL: °a. Pain is best controlled by a usual combination of three different methods TOGETHER: °i. Ice/Heat °ii. Over the counter pain medication °iii. Prescription pain medication °b. Most patients will experience some swelling and bruising around the incisions.  Ice packs or heating pads (30-60 minutes up to 6 times a day) will help. Use ice for the first few days to help decrease swelling and bruising, then switch to heat to help relax tight/sore spots and speed recovery.  Some people prefer to use ice alone, heat alone, alternating between ice & heat.  Experiment to what works for you.  Swelling and bruising can take several weeks to resolve.   °c. It is helpful to take an over-the-counter pain medication  regularly for the first few weeks.  Choose one of the following that works best for you: °i. Naproxen (Aleve, etc)  Two 220mg tabs twice a day °ii. Ibuprofen (Advil, etc) Three 200mg tabs four times a day (every meal & bedtime) °iii. Acetaminophen (Tylenol, etc) 500-650mg four times a day (every meal & bedtime) °d. A  prescription for pain medication (such as oxycodone, hydrocodone, etc) should be given to you upon discharge.  Take your pain medication as prescribed.  °i. If you are having problems/concerns with the prescription medicine (does not control pain, nausea, vomiting, rash, itching, etc), please call us [phone] to see if we need to switch you to a different pain medicine that will work better for you and/or control your side effect better. °ii. If you need a refill on your pain medication, please contact your pharmacy.  They will contact our office to request authorization. Prescriptions will not be filled after 5 pm or on week-ends. °4. Avoid getting constipated.  Between the surgery and the pain medications, it is common to experience some constipation.  Increasing fluid intake and taking a fiber supplement (such as Metamucil, Citrucel, FiberCon, MiraLax, etc) 1-2 times a day regularly will usually help prevent this problem from occurring.  A mild laxative (prune juice, Milk of Magnesia, MiraLax, etc) should be taken according to package directions if there are no bowel movements after 48 hours.   °5. Wash / shower every day.  You may shower over the dressings as they are waterproof.  Continue to shower over incision(s) after the dressing is off. °6. Remove your waterproof bandages   5 days after surgery.  You may leave the incision open to air.  You may have skin tapes (Steri Strips) covering the incision(s).  Leave them on until one week, then remove.  You may replace a dressing/Band-Aid to cover the incision for comfort if you wish.  ° ° ° ° °7. ACTIVITIES as tolerated:   °a. You may resume  regular (light) daily activities beginning the next day--such as daily self-care, walking, climbing stairs--gradually increasing activities as tolerated.  If you can walk 30 minutes without difficulty, it is safe to try more intense activity such as jogging, treadmill, bicycling, low-impact aerobics, swimming, etc. °b. Save the most intensive and strenuous activity for last such as sit-ups, heavy lifting, contact sports, etc  Refrain from any heavy lifting or straining until you are off narcotics for pain control.   °c. DO NOT PUSH THROUGH PAIN.  Let pain be your guide: If it hurts to do something, don't do it.  Pain is your body warning you to avoid that activity for another week until the pain goes down. °d. You may drive when you are no longer taking prescription pain medication, you can comfortably wear a seatbelt, and you can safely maneuver your car and apply brakes. °e. You may have sexual intercourse when it is comfortable.  °8. FOLLOW UP in our office °a. Please call [facility] at [phone] to set up an appointment to see your surgeon in the office for a follow-up appointment approximately 2-3 weeks after your surgery. °b. Make sure that you call for this appointment the day you arrive home to insure a convenient appointment time. °9. IF YOU HAVE DISABILITY OR FAMILY LEAVE FORMS, BRING THEM TO THE OFFICE FOR PROCESSING.  DO NOT GIVE THEM TO YOUR DOCTOR. ° ° °WHEN TO CALL US [phone]: °1. Poor pain control °2. Reactions / problems with new medications (rash/itching, nausea, etc)  °3. Fever over 101.5 F (38.5 C) °4. Worsening swelling or bruising °5. Continued bleeding from incision. °6. Increased pain, redness, or drainage from the incision °7. Difficulty breathing / swallowing ° ° The clinic staff is available to answer your questions during regular business hours (8:30am-5pm).  Please don’t hesitate to call and ask to speak to one of our nurses for clinical concerns.  ° If you have a medical emergency,  go to the nearest emergency room or call 911. ° A surgeon from [facility] is always on call at the hospitals ° ° °[facility] °[address], [address], [place], [place]  [address] ? °MAIN: [phone] ? TOLL FREE: [phone] ?  °FAX [phone] °[email or web address] ° ° °Post Anesthesia Home Care Instructions ° °Activity: °Get plenty of rest for the remainder of the day. A responsible individual must stay with you for 24 hours following the procedure.  °For the next 24 hours, DO NOT: °-Drive a car °-Operate machinery °-Drink alcoholic beverages °-Take any medication unless instructed by your physician °-Make any legal decisions or sign important papers. ° °Meals: °Start with liquid foods such as gelatin or soup. Progress to regular foods as tolerated. Avoid greasy, spicy, heavy foods. If nausea and/or vomiting occur, drink only clear liquids until the nausea and/or vomiting subsides. Call your physician if vomiting continues. ° °Special Instructions/Symptoms: °Your throat may feel dry or sore from the anesthesia or the breathing tube placed in your throat during surgery. If this causes discomfort, gargle with warm salt water. The discomfort should disappear within 24 hours. ° °  If you had a scopolamine patch placed behind your ear for the management of post- operative nausea and/or vomiting: ° °1. The medication in the patch is effective for 72 hours, after which it should be removed.  Wrap patch in a tissue and discard in the trash. Wash hands thoroughly with soap and water. °2. You may remove the patch earlier than 72 hours if you experience unpleasant side effects which may include dry mouth, dizziness or visual disturbances. °3. Avoid touching the patch. Wash your hands with soap and water after contact with the patch. °  ° °

## 2017-02-22 NOTE — Interval H&P Note (Signed)
History and Physical Interval Note:  02/22/2017 8:23 AM  Dennis Bryant  has presented today for surgery, with the diagnosis of 2 BACK LIPOMAS 5CM AND 3CM, 1 ABDOMEN LIPOMA 5 CM  The various methods of treatment have been discussed with the patient and family. After consideration of risks, benefits and other options for treatment, the patient has consented to  Procedure(s): EXCISION OF 2 BACK LIPOMAS  AND 1 ABDOMEN LIPOMA (N/A) as a surgical intervention .  The patient's history has been reviewed, patient examined, no change in status, stable for surgery.  I have reviewed the patient's chart and labs.  Questions were answered to the patient's satisfaction.     Zareya Tuckett A.

## 2017-02-23 ENCOUNTER — Encounter (HOSPITAL_BASED_OUTPATIENT_CLINIC_OR_DEPARTMENT_OTHER): Payer: Self-pay | Admitting: Surgery

## 2017-04-07 ENCOUNTER — Other Ambulatory Visit: Payer: Self-pay

## 2017-07-05 ENCOUNTER — Other Ambulatory Visit: Payer: Self-pay

## 2017-08-27 ENCOUNTER — Other Ambulatory Visit: Payer: Self-pay | Admitting: Family Medicine

## 2017-08-27 DIAGNOSIS — I10 Essential (primary) hypertension: Secondary | ICD-10-CM

## 2017-09-23 ENCOUNTER — Other Ambulatory Visit: Payer: Self-pay | Admitting: Family Medicine

## 2017-09-23 DIAGNOSIS — I10 Essential (primary) hypertension: Secondary | ICD-10-CM

## 2017-10-06 ENCOUNTER — Ambulatory Visit: Payer: 59 | Admitting: Family Medicine

## 2017-10-19 ENCOUNTER — Other Ambulatory Visit: Payer: Self-pay | Admitting: Family Medicine

## 2017-10-19 DIAGNOSIS — I10 Essential (primary) hypertension: Secondary | ICD-10-CM

## 2017-11-09 ENCOUNTER — Other Ambulatory Visit: Payer: Self-pay | Admitting: Family Medicine

## 2017-11-09 DIAGNOSIS — I10 Essential (primary) hypertension: Secondary | ICD-10-CM

## 2017-12-12 ENCOUNTER — Other Ambulatory Visit: Payer: Self-pay

## 2017-12-13 ENCOUNTER — Ambulatory Visit (INDEPENDENT_AMBULATORY_CARE_PROVIDER_SITE_OTHER): Payer: 59 | Admitting: Family Medicine

## 2017-12-13 ENCOUNTER — Encounter: Payer: Self-pay | Admitting: Family Medicine

## 2017-12-13 VITALS — BP 120/82 | HR 80 | Ht 68.0 in | Wt 250.0 lb

## 2017-12-13 DIAGNOSIS — I10 Essential (primary) hypertension: Secondary | ICD-10-CM

## 2017-12-13 DIAGNOSIS — D225 Melanocytic nevi of trunk: Secondary | ICD-10-CM | POA: Diagnosis not present

## 2017-12-13 MED ORDER — LISINOPRIL-HYDROCHLOROTHIAZIDE 20-25 MG PO TABS: 1.0000 | ORAL_TABLET | Freq: Every day | ORAL | 3 refills | Status: DC

## 2017-12-13 NOTE — Assessment & Plan Note (Signed)
Chronic Controlled Continue lisinopril20 mg-HCTZ 25 mg daily. Check renal panel

## 2017-12-13 NOTE — Progress Notes (Signed)
Name: Dennis Bryant   MRN: [id number]    DOB: 1976/01/26   Date:12/13/2017       Progress Note  Subjective  Chief Complaint  Chief Complaint  Patient presents with  . Hypertension    Hypertension  This is a chronic problem. The current episode started more than 1 year ago. The problem has been gradually improving since onset. The problem is controlled. Pertinent negatives include no anxiety, blurred vision, chest pain, headaches, malaise/fatigue, neck pain, orthopnea, palpitations, peripheral edema, PND, shortness of breath or sweats. There are no associated agents to hypertension. There are no known risk factors for coronary artery disease. Past treatments include ACE inhibitors and diuretics. The current treatment provides moderate improvement. There are no compliance problems.  There is no history of angina, kidney disease, CAD/MI, CVA, heart failure, left ventricular hypertrophy, PVD or retinopathy. There is no history of chronic renal disease, a hypertension causing med or renovascular disease. Hypercortisolism: ren.    Essential hypertension Chronic Controlled Continue lisinopril20 mg-HCTZ 25 mg daily. Check renal panel   Past Medical History:  Diagnosis Date  . Hypertension   . Lipoma    back and abdomen  . Vitamin D deficiency     Past Surgical History:  Procedure Laterality Date  . LIPOMA EXCISION N/A 02/22/2017   Procedure: EXCISION OF 2 BACK LIPOMAS  AND 1 ABDOMEN LIPOMA;  Surgeon: Erroll Luna, MD;  Location: [facility];  Service: General;  Laterality: N/A;    Family History  Problem Relation Age of Onset  . Cancer Maternal Uncle   . Cancer Maternal Grandmother     Social History   Socioeconomic History  . Marital status: Married    Spouse name: Not on file  . Number of children: Not on file  . Years of education: Not on file  . Highest education level: Not on file  Occupational History  . Not on file  Social Needs  . Financial resource  strain: Not on file  . Food insecurity:    Worry: Not on file    Inability: Not on file  . Transportation needs:    Medical: Not on file    Non-medical: Not on file  Tobacco Use  . Smoking status: Never Smoker  . Smokeless tobacco: Never Used  Substance and Sexual Activity  . Alcohol use: No    Alcohol/week: 0.0 oz  . Drug use: No  . Sexual activity: Yes  Lifestyle  . Physical activity:    Days per week: Not on file    Minutes per session: Not on file  . Stress: Not on file  Relationships  . Social connections:    Talks on phone: Not on file    Gets together: Not on file    Attends religious service: Not on file    Active member of club or organization: Not on file    Attends meetings of clubs or organizations: Not on file    Relationship status: Not on file  . Intimate partner violence:    Fear of current or ex partner: Not on file    Emotionally abused: Not on file    Physically abused: Not on file    Forced sexual activity: Not on file  Other Topics Concern  . Not on file  Social History Narrative  . Not on file    No Known Allergies  Outpatient Medications Prior to Visit  Medication Sig Dispense Refill  . aspirin 81 MG chewable tablet Chew 4 tablets (324  mg total) by mouth daily. (Patient taking differently: Chew 162 mg by mouth daily. ) 30 tablet 0  . Cholecalciferol (VITAMIN D) 2000 UNITS CAPS Take 1 capsule (2,000 Units total) by mouth daily. 30 capsule   . Multiple Vitamins-Minerals (MULTIVITAMIN) tablet Take 1 tablet by mouth daily. 30 tablet 0  . lisinopril-hydrochlorothiazide (PRINZIDE,ZESTORETIC) 20-25 MG tablet TAKE 1 TABLET BY MOUTH EVERY DAY 7 tablet 0  . HYDROcodone-acetaminophen (NORCO/VICODIN) 5-325 MG tablet Take 1-2 tablets by mouth every 6 (six) hours as needed for moderate pain. 15 tablet 0   No facility-administered medications prior to visit.     Review of Systems  Constitutional: Negative for chills, fever, malaise/fatigue and weight loss.   HENT: Negative for ear discharge, ear pain and sore throat.   Eyes: Negative for blurred vision.  Respiratory: Negative for cough, sputum production, shortness of breath and wheezing.   Cardiovascular: Negative for chest pain, palpitations, orthopnea, leg swelling and PND.  Gastrointestinal: Negative for abdominal pain, blood in stool, constipation, diarrhea, heartburn, melena and nausea.  Genitourinary: Negative for dysuria, frequency, hematuria and urgency.  Musculoskeletal: Negative for back pain, joint pain, myalgias and neck pain.  Skin: Negative for rash.  Neurological: Negative for dizziness, tingling, sensory change, focal weakness and headaches.  Endo/Heme/Allergies: Negative for environmental allergies and polydipsia. Does not bruise/bleed easily.  Psychiatric/Behavioral: Negative for depression and suicidal ideas. The patient is not nervous/anxious and does not have insomnia.      Objective  Vitals:   12/13/17 1547  BP: 120/82  Pulse: 80  Weight: 250 lb (113.4 kg)  Height: 5\' 8"  (1.727 m)    Physical Exam  Constitutional: He is oriented to person, place, and time.  HENT:  Head: Normocephalic.  Right Ear: External ear normal.  Left Ear: External ear normal.  Nose: Nose normal.  Mouth/Throat: Oropharynx is clear and moist.  Eyes: Pupils are equal, round, and reactive to light. Conjunctivae and EOM are normal. Right eye exhibits no discharge. Left eye exhibits no discharge. No scleral icterus.  Neck: Normal range of motion. Neck supple. No JVD present. No tracheal deviation present. No thyromegaly present.  Cardiovascular: Normal rate, regular rhythm, normal heart sounds and intact distal pulses. Exam reveals no gallop and no friction rub.  No murmur heard. Pulmonary/Chest: Breath sounds normal. No respiratory distress. He has no wheezes. He has no rales.  Abdominal: Soft. Bowel sounds are normal. He exhibits no mass. There is no hepatosplenomegaly. There is no  tenderness. There is no rebound, no guarding and no CVA tenderness.  Musculoskeletal: Normal range of motion. He exhibits no edema or tenderness.  Lymphadenopathy:    He has no cervical adenopathy.  Neurological: He is alert and oriented to person, place, and time. He has normal strength and normal reflexes. No cranial nerve deficit.  Skin: Skin is warm. No rash noted.  Nevus varigated/ irregular border      Assessment & Plan  Problem List Items Addressed This Visit      Cardiovascular and Mediastinum   Essential hypertension - Primary    Chronic Controlled Continue lisinopril20 mg-HCTZ 25 mg daily. Check renal panel      Relevant Medications   lisinopril-hydrochlorothiazide (PRINZIDE,ZESTORETIC) 20-25 MG tablet   Other Relevant Orders   Ambulatory referral to Dermatology   Renal Function Panel    Other Visit Diagnoses    Melanocytic nevus of trunk       Patient uncertain of any changes. Noted irregular border /2x1 cm / varigated pigmentation Referral to  dermatology.      Meds ordered this encounter  Medications  . lisinopril-hydrochlorothiazide (PRINZIDE,ZESTORETIC) 20-25 MG tablet    Sig: Take 1 tablet by mouth daily.    Dispense:  90 tablet    Refill:  3      Dr. Otilio [facility]  12/13/17

## 2017-12-14 LAB — RENAL FUNCTION PANEL
ALBUMIN: 4.3 g/dL (ref 3.5–5.5)
BUN / CREAT RATIO: 11 (ref 9–20)
BUN: 12 mg/dL (ref 6–24)
CALCIUM: 10.2 mg/dL (ref 8.7–10.2)
CHLORIDE: 96 mmol/L (ref 96–106)
CO2: 26 mmol/L (ref 20–29)
Creatinine, Ser: 1.13 mg/dL (ref 0.76–1.27)
GFR calc non Af Amer: 79 mL/min/{1.73_m2} (ref >59)
GFR, EST AFRICAN AMERICAN: 92 mL/min/{1.73_m2} (ref >59)
GLUCOSE: 86 mg/dL (ref 65–99)
POTASSIUM: 4.4 mmol/L (ref 3.5–5.2)
Phosphorus: 3.8 mg/dL (ref 2.5–4.5)
Sodium: 138 mmol/L (ref 134–144)

## 2018-02-24 IMAGING — CT CT HEAD W/O CM
3 series · 15 of 47 positions shown, 18 images · non-contrast
Comparison: None.

CLINICAL DATA: 40-year-old male with headache, and numbness of the
right foot.

EXAM:
CT HEAD WITHOUT CONTRAST
TECHNIQUE: Contiguous axial images were obtained from the base of the skull
through the vertex without intravenous contrast.

[Series 2: head wo · axial · 0.46mm/px · z∈[+125,+260]mm · 9 of 33 slices shown, 12 images]
[im 3/33  brain]
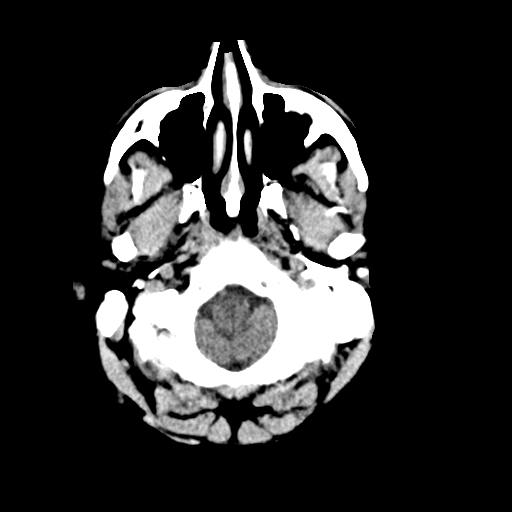
[im 3/33  bone]
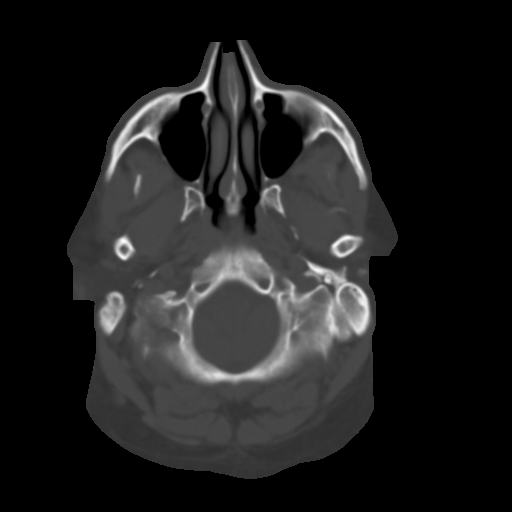
[im 6/33  brain]
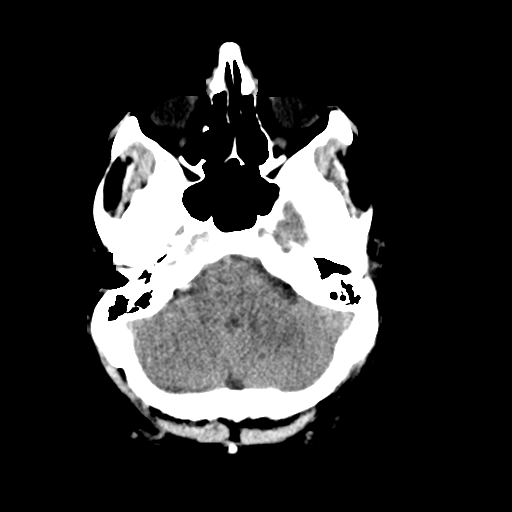
[im 9/33  brain]
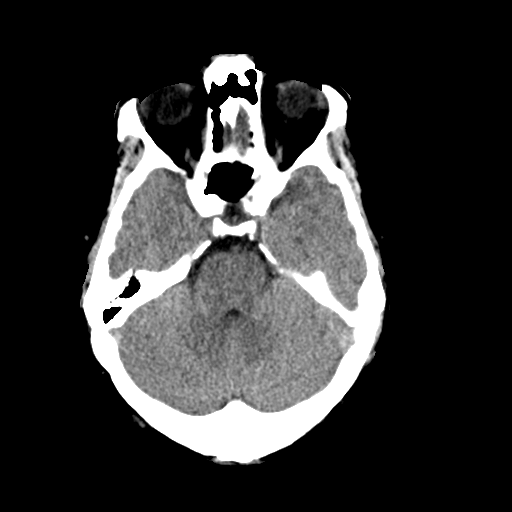
[im 13/33  brain]
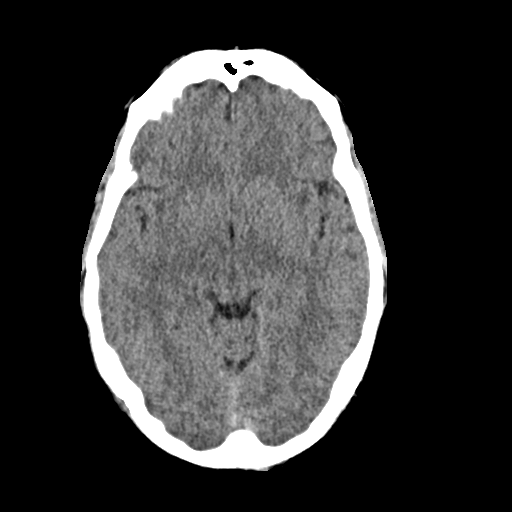
[im 17/33  brain]
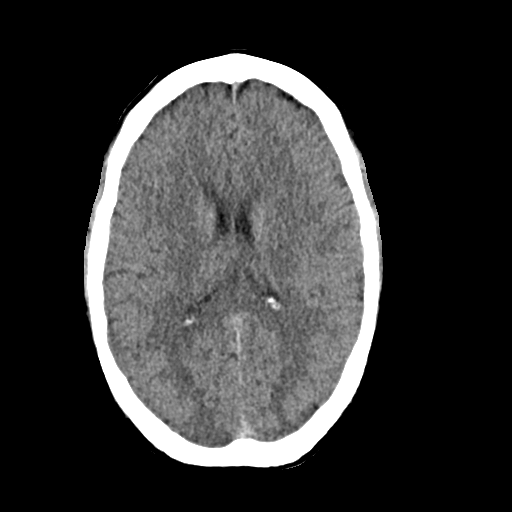
[im 17/33  bone]
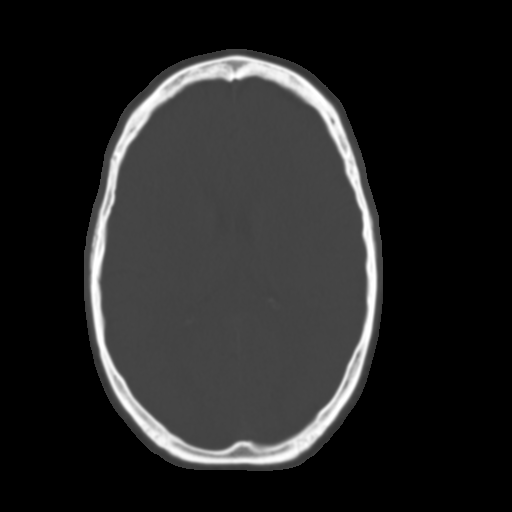
[im 20/33  brain]
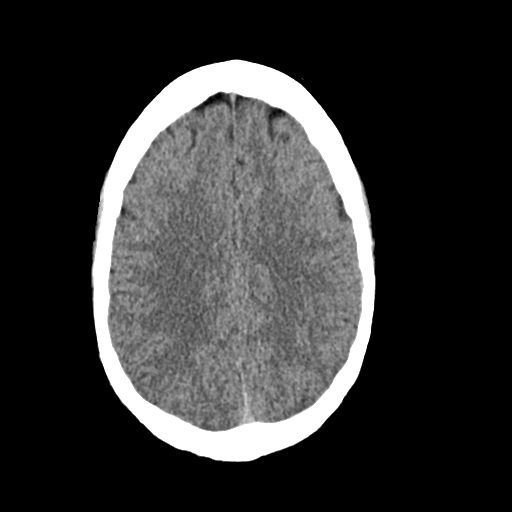
[im 24/33  brain]
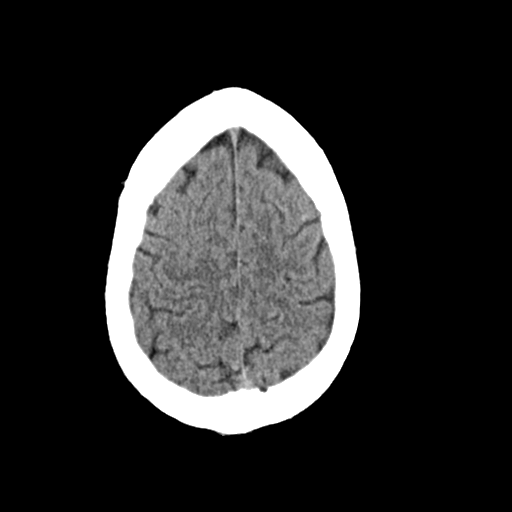
[im 27/33  brain]
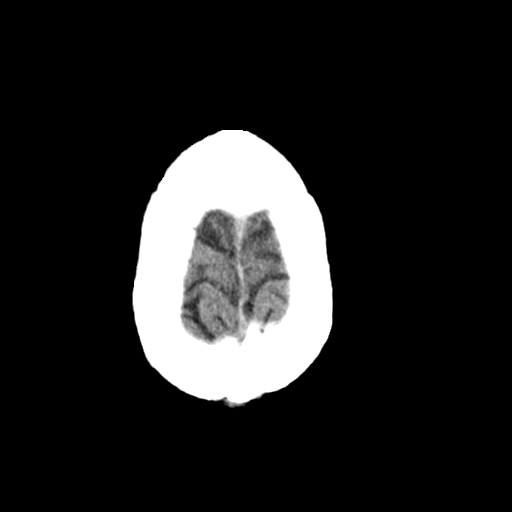
[im 30/33  brain]
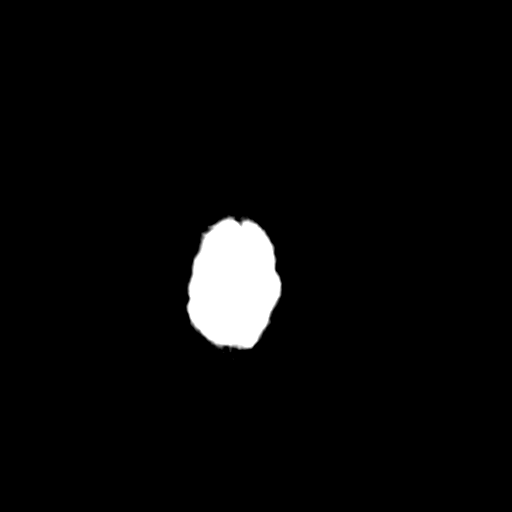
[im 30/33  bone]
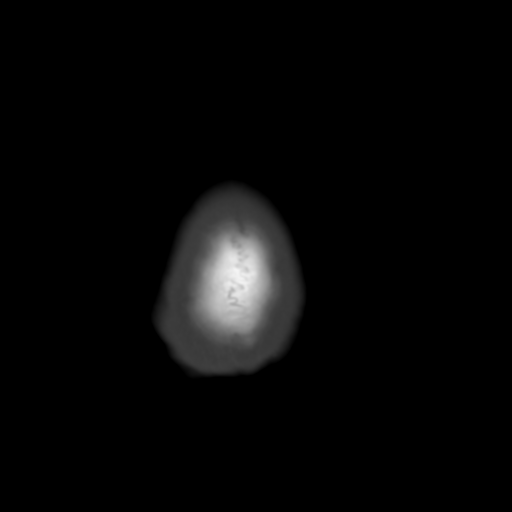

[Series 4: coronal soft · coronal · 0.32mm/px · 3 of 71 slices shown]
[im 24/71  brain]
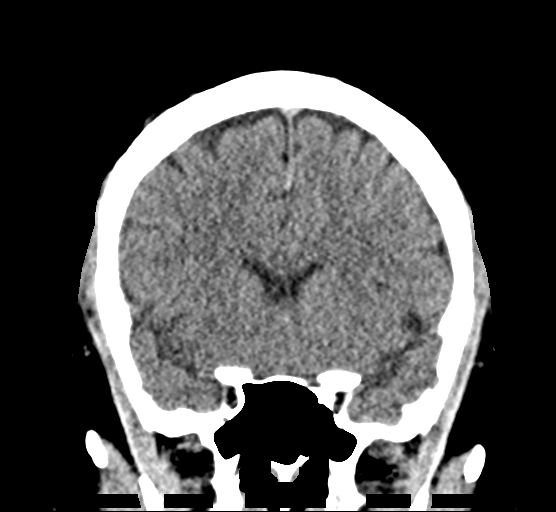
[im 32/71  brain]
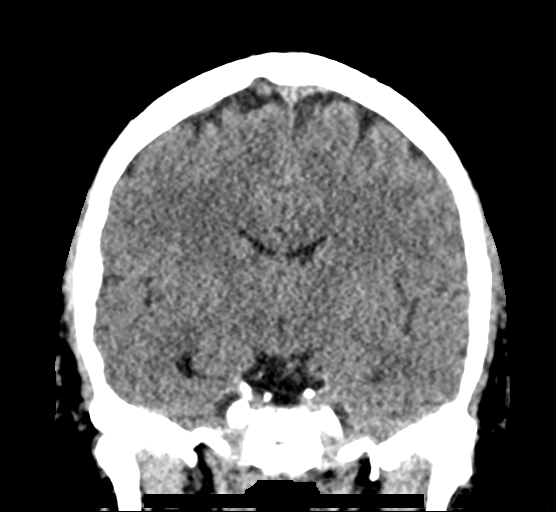
[im 39/71  brain]
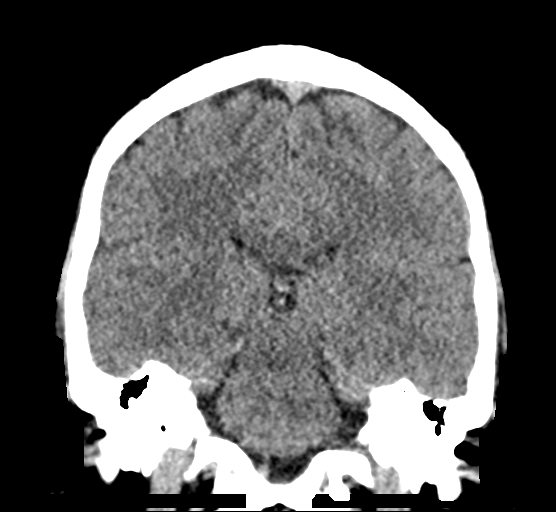

[Series 5: sag soft · sagittal · 0.32mm/px · 3 of 67 slices shown]
[im 23/67  brain]
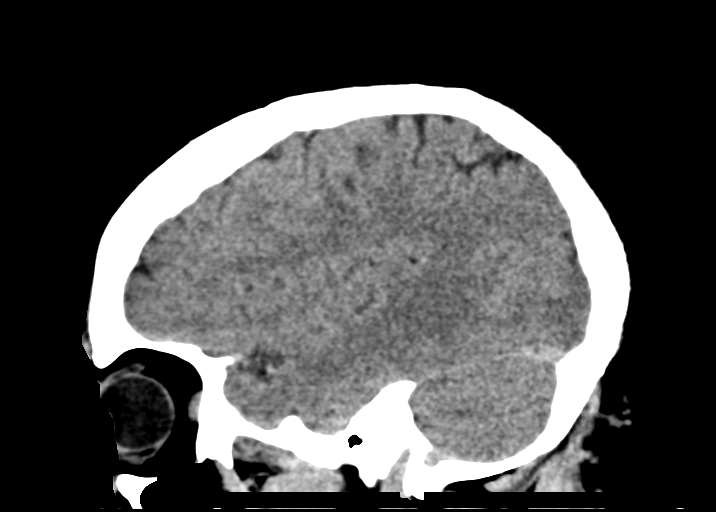
[im 34/67  brain]
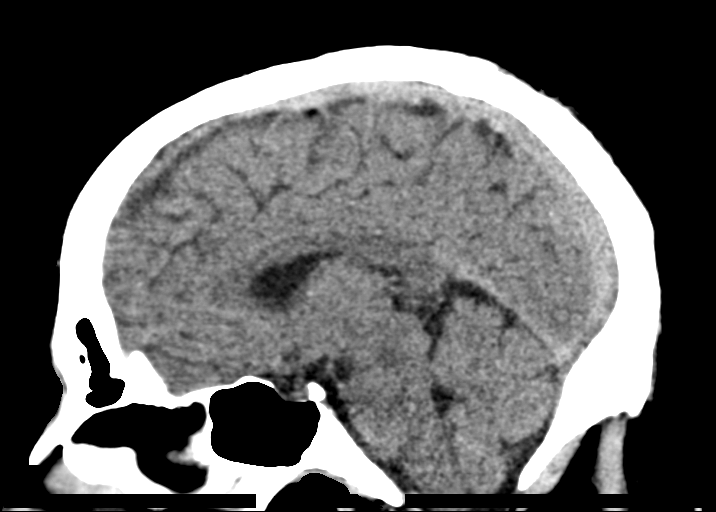
[im 45/67  brain]
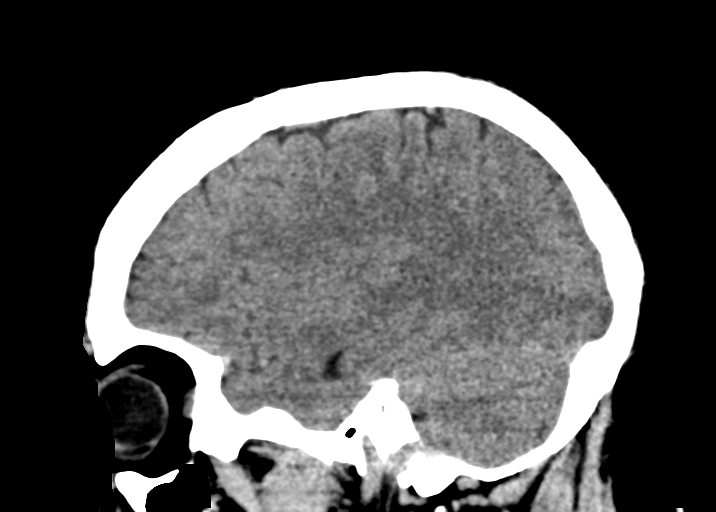

[15 of 47 positions shown; findings below may reference images not displayed]

FINDINGS: Brain: No evidence of acute infarction, hemorrhage, hydrocephalus,
extra-axial collection or mass lesion/mass effect.

Vascular: No hyperdense vessel or unexpected calcification.

Skull: Normal. Negative for fracture or focal lesion.

Sinuses/Orbits: No acute finding.

Other: None.
IMPRESSION: No acute intracranial pathology.

## 2018-12-03 ENCOUNTER — Other Ambulatory Visit: Payer: Self-pay | Admitting: Family Medicine

## 2018-12-03 DIAGNOSIS — I10 Essential (primary) hypertension: Secondary | ICD-10-CM

## 2019-01-01 ENCOUNTER — Other Ambulatory Visit: Payer: Self-pay

## 2019-01-01 ENCOUNTER — Encounter: Payer: Self-pay | Admitting: Family Medicine

## 2019-01-01 ENCOUNTER — Ambulatory Visit (INDEPENDENT_AMBULATORY_CARE_PROVIDER_SITE_OTHER): Payer: Managed Care, Other (non HMO) | Admitting: Family Medicine

## 2019-01-01 VITALS — BP 110/70 | HR 64 | Ht 68.0 in | Wt 214.0 lb

## 2019-01-01 DIAGNOSIS — I10 Essential (primary) hypertension: Secondary | ICD-10-CM

## 2019-01-01 DIAGNOSIS — E669 Obesity, unspecified: Secondary | ICD-10-CM

## 2019-01-01 MED ORDER — LISINOPRIL 10 MG PO TABS: 10.0000 mg | ORAL_TABLET | Freq: Every day | ORAL | 1 refills | Status: DC

## 2019-01-01 NOTE — Progress Notes (Signed)
Date:  01/01/2019   Name:  Dennis Bryant   DOB:  1975-07-15   MRN:  [id number]   Chief Complaint: Hypertension (needs refill and labs)  Hypertension This is a chronic problem. The current episode started more than 1 year ago. The problem has been waxing and waning since onset. The problem is controlled. Pertinent negatives include no anxiety, blurred vision, chest pain, headaches, malaise/fatigue, neck pain, orthopnea, palpitations, peripheral edema, PND, shortness of breath or sweats. There are no associated agents to hypertension. There are no known risk factors for coronary artery disease. Past treatments include ACE inhibitors and diuretics. The current treatment provides moderate improvement. There are no compliance problems.  There is no history of angina, kidney disease, CAD/MI, CVA, heart failure, left ventricular hypertrophy, PVD or retinopathy. There is no history of chronic renal disease, a hypertension causing med or renovascular disease.    Review of Systems  Constitutional: Negative for chills, fever and malaise/fatigue.  HENT: Negative for drooling, ear discharge, ear pain and sore throat.   Eyes: Negative for blurred vision.  Respiratory: Negative for cough, shortness of breath and wheezing.   Cardiovascular: Negative for chest pain, palpitations, orthopnea, leg swelling and PND.  Gastrointestinal: Negative for abdominal pain, blood in stool, constipation, diarrhea and nausea.  Endocrine: Negative for polydipsia.  Genitourinary: Negative for dysuria, frequency, hematuria and urgency.  Musculoskeletal: Negative for back pain, myalgias and neck pain.  Skin: Negative for rash.  Allergic/Immunologic: Negative for environmental allergies.  Neurological: Negative for dizziness and headaches.  Hematological: Does not bruise/bleed easily.  Psychiatric/Behavioral: Negative for suicidal ideas. The patient is not nervous/anxious.     Patient Active Problem List   Diagnosis Date  Noted  . History of transient ischemic attack (TIA) 07/21/2016  . Hyperlipidemia 01/21/2015  . Vitamin D deficiency 01/21/2015  . Obesity (BMI 30.0-34.9) 01/03/2015  . Essential hypertension 01/03/2015  . Headache 01/03/2015  . Erectile dysfunction 01/03/2015  . FH: COPD (chronic obstructive pulmonary disease) 01/03/2015    No Known Allergies  Past Surgical History:  Procedure Laterality Date  . LIPOMA EXCISION N/A 02/22/2017   Procedure: EXCISION OF 2 BACK LIPOMAS  AND 1 ABDOMEN LIPOMA;  Surgeon: Erroll Luna, MD;  Location: [facility];  Service: General;  Laterality: N/A;    Social History   Tobacco Use  . Smoking status: Never Smoker  . Smokeless tobacco: Never Used  Substance Use Topics  . Alcohol use: No    Alcohol/week: 0.0 standard drinks  . Drug use: No     Medication list has been reviewed and updated.  Current Meds  Medication Sig  . aspirin EC 81 MG tablet Take 81 mg by mouth daily.  Marland Kitchen lisinopril-hydrochlorothiazide (ZESTORETIC) 20-25 MG tablet TAKE 1 TABLET BY MOUTH EVERY DAY  . Multiple Vitamins-Minerals (MULTIVITAMIN) tablet Take 1 tablet by mouth daily.    PHQ 2/9 Scores 01/01/2019 01/14/2017 10/17/2015 02/10/2015  PHQ - 2 Score 0 0 0 0  PHQ- 9 Score 1 1 - -    BP Readings from Last 3 Encounters:  01/01/19 110/70  12/13/17 120/82  02/22/17 120/88    Physical Exam Vitals signs and nursing note reviewed.  Constitutional:      Appearance: He is obese.  HENT:     Head: Normocephalic.     Right Ear: Tympanic membrane, ear canal and external ear normal.     Left Ear: Tympanic membrane, ear canal and external ear normal.     Nose: Nose normal. No  congestion or rhinorrhea.     Mouth/Throat:     Mouth: Mucous membranes are moist.  Eyes:     General: No scleral icterus.       Right eye: No discharge.        Left eye: No discharge.     Conjunctiva/sclera: Conjunctivae normal.     Pupils: Pupils are equal, round, and reactive to light.   Neck:     Musculoskeletal: Normal range of motion and neck supple.     Thyroid: No thyromegaly.     Vascular: No JVD.     Trachea: No tracheal deviation.  Cardiovascular:     Rate and Rhythm: Normal rate and regular rhythm.     Pulses: Normal pulses.     Heart sounds: Normal heart sounds. No murmur. No friction rub. No gallop.   Pulmonary:     Effort: No respiratory distress.     Breath sounds: Normal breath sounds. No wheezing, rhonchi or rales.  Abdominal:     General: Bowel sounds are normal.     Palpations: Abdomen is soft. There is no mass.     Tenderness: There is no abdominal tenderness. There is no guarding or rebound.  Musculoskeletal: Normal range of motion.        General: No tenderness.  Lymphadenopathy:     Cervical: No cervical adenopathy.  Skin:    General: Skin is warm.     Findings: No rash.  Neurological:     Mental Status: He is alert and oriented to person, place, and time.     Cranial Nerves: No cranial nerve deficit.     Deep Tendon Reflexes: Reflexes are normal and symmetric.     Wt Readings from Last 3 Encounters:  01/01/19 214 lb (97.1 kg)  12/13/17 250 lb (113.4 kg)  02/22/17 248 lb (112.5 kg)    BP 110/70   Pulse 64   Ht 5\' 8"  (1.727 m)   Wt 214 lb (97.1 kg)   BMI 32.54 kg/m   Assessment and Plan:   1. Essential hypertension Chronic.  Controlled.  Patient has done remarkably well as terms of his dietary approach and weight loss.  Patient has lost a significant amount of weight and will continue to do so.  We will therefore eliminate the hydrochlorothiazide portion of the patient's regimen and continue only on lisinopril 10 mg once a day we will recheck in 6 months. - lisinopril (ZESTRIL) 10 MG tablet; Take 1 tablet (10 mg total) by mouth daily.  Dispense: 90 tablet; Refill: 1  2. Obesity (BMI 30.0-34.9) As noted patient has undergone strict calorie counts with exercise and has initiated a greater than 30 pound weight loss.  Patient will  continue this endeavor in order to reach a goal under 200.  Patient is being encouraged to do so and will recheck in 6 months for verification.

## 2019-02-13 ENCOUNTER — Ambulatory Visit (INDEPENDENT_AMBULATORY_CARE_PROVIDER_SITE_OTHER): Payer: Managed Care, Other (non HMO) | Admitting: Family Medicine

## 2019-02-13 ENCOUNTER — Other Ambulatory Visit: Payer: Self-pay

## 2019-02-13 ENCOUNTER — Encounter: Payer: Self-pay | Admitting: Family Medicine

## 2019-02-13 VITALS — BP 122/80 | HR 72 | Ht 68.0 in | Wt 212.0 lb

## 2019-02-13 DIAGNOSIS — I10 Essential (primary) hypertension: Secondary | ICD-10-CM | POA: Diagnosis not present

## 2019-02-13 MED ORDER — LISINOPRIL-HYDROCHLOROTHIAZIDE 10-12.5 MG PO TABS: 1.0000 | ORAL_TABLET | Freq: Every day | ORAL | 1 refills | Status: DC

## 2019-02-13 NOTE — Progress Notes (Signed)
Name: Dennis Bryant   MRN: [id number]    DOB: 15-Jul-1975   Date:02/13/2019       Progress Note  Subjective  Chief Complaint  Chief Complaint  Patient presents with  . Hypertension    follow up from changing med to Lisinopril 10mg  from Lisinopril?HCTZ 20/25mg - started having headaches so went back to old med    Hypertension This is a chronic problem. The current episode started more than 1 year ago. The problem has been waxing and waning since onset. The problem is controlled. Pertinent negatives include no anxiety, blurred vision, chest pain, headaches, malaise/fatigue, neck pain, orthopnea, palpitations, peripheral edema, PND, shortness of breath or sweats. There are no associated agents to hypertension. Past treatments include ACE inhibitors (diuretic in past). The current treatment provides moderate improvement. There are no compliance problems.  There is no history of angina, kidney disease, CAD/MI, CVA, heart failure, left ventricular hypertrophy, PVD or retinopathy.     No problem-specific Assessment & Plan notes found for this encounter.   Past Medical History:  Diagnosis Date  . Hypertension   . Lipoma    back and abdomen  . Vitamin D deficiency     Past Surgical History:  Procedure Laterality Date  . LIPOMA EXCISION N/A 02/22/2017   Procedure: EXCISION OF 2 BACK LIPOMAS  AND 1 ABDOMEN LIPOMA;  Surgeon: Erroll Luna, MD;  Location: [facility];  Service: General;  Laterality: N/A;    Family History  Problem Relation Age of Onset  . Cancer Maternal Uncle   . Cancer Maternal Grandmother     Social History   Socioeconomic History  . Marital status: Married    Spouse name: Not on file  . Number of children: Not on file  . Years of education: Not on file  . Highest education level: Not on file  Occupational History  . Not on file  Social Needs  . Financial resource strain: Not on file  . Food insecurity    Worry: Not on file    Inability: Not  on file  . Transportation needs    Medical: Not on file    Non-medical: Not on file  Tobacco Use  . Smoking status: Never Smoker  . Smokeless tobacco: Never Used  Substance and Sexual Activity  . Alcohol use: No    Alcohol/week: 0.0 standard drinks  . Drug use: No  . Sexual activity: Yes  Lifestyle  . Physical activity    Days per week: Not on file    Minutes per session: Not on file  . Stress: Not on file  Relationships  . Social Herbalist on phone: Not on file    Gets together: Not on file    Attends religious service: Not on file    Active member of club or organization: Not on file    Attends meetings of clubs or organizations: Not on file    Relationship status: Not on file  . Intimate partner violence    Fear of current or ex partner: Not on file    Emotionally abused: Not on file    Physically abused: Not on file    Forced sexual activity: Not on file  Other Topics Concern  . Not on file  Social History Narrative  . Not on file    No Known Allergies   Review of Systems  Constitutional: Negative for chills, fever, malaise/fatigue and weight loss.  HENT: Negative for ear discharge, ear pain and sore throat.  Eyes: Negative for blurred vision.  Respiratory: Negative for cough, sputum production, shortness of breath and wheezing.   Cardiovascular: Negative for chest pain, palpitations, orthopnea, leg swelling and PND.  Gastrointestinal: Negative for abdominal pain, blood in stool, constipation, diarrhea, heartburn, melena and nausea.  Genitourinary: Negative for dysuria, frequency, hematuria and urgency.  Musculoskeletal: Negative for back pain, joint pain, myalgias and neck pain.  Skin: Negative for rash.  Neurological: Negative for dizziness, tingling, sensory change, focal weakness and headaches.  Endo/Heme/Allergies: Negative for environmental allergies and polydipsia. Does not bruise/bleed easily.  Psychiatric/Behavioral: Negative for depression  and suicidal ideas. The patient is not nervous/anxious and does not have insomnia.      Objective  Vitals:   02/13/19 1021  BP: 122/80  Pulse: 72  Weight: 212 lb (96.2 kg)  Height: 5\' 8"  (1.727 m)    Physical Exam Vitals signs and nursing note reviewed.  HENT:     Head: Normocephalic.     Right Ear: Tympanic membrane, ear canal and external ear normal. There is no impacted cerumen.     Left Ear: Tympanic membrane, ear canal and external ear normal. There is no impacted cerumen.     Nose: Nose normal. No congestion or rhinorrhea.     Mouth/Throat:     Mouth: Mucous membranes are moist.     Pharynx: No oropharyngeal exudate or posterior oropharyngeal erythema.  Eyes:     General: No scleral icterus.       Right eye: No discharge.        Left eye: No discharge.     Conjunctiva/sclera: Conjunctivae normal.     Pupils: Pupils are equal, round, and reactive to light.  Neck:     Musculoskeletal: Normal range of motion and neck supple.     Thyroid: No thyromegaly.     Vascular: No JVD.     Trachea: No tracheal deviation.  Cardiovascular:     Rate and Rhythm: Normal rate and regular rhythm.     Heart sounds: Normal heart sounds. No murmur. No friction rub. No gallop.   Pulmonary:     Effort: Pulmonary effort is normal. No respiratory distress.     Breath sounds: Normal breath sounds. No stridor. No wheezing, rhonchi or rales.  Chest:     Chest wall: No tenderness.  Abdominal:     General: Bowel sounds are normal.     Palpations: Abdomen is soft. There is no mass.     Tenderness: There is no abdominal tenderness. There is no guarding or rebound.  Musculoskeletal: Normal range of motion.        General: No tenderness.  Lymphadenopathy:     Cervical: No cervical adenopathy.  Skin:    General: Skin is warm.     Coloration: Skin is not jaundiced or pale.     Findings: No bruising, erythema, lesion or rash.  Neurological:     Mental Status: He is alert and oriented to person,  place, and time.     Cranial Nerves: No cranial nerve deficit.     Deep Tendon Reflexes: Reflexes are normal and symmetric.       Assessment & Plan 1. Essential hypertension Patient had a headache when he was only on the lisinopril 10 mg.  He resumed his combination and comes in today on 10 mg with out symptoms of discomfort and an acceptable blood pressure range.  However I think we will go to the combination of the lower dose of lisinopril hydrochlorothiazide 10-12.5 once  a day to see how he tolerates this and maybe bring his blood pressure down just a little bit more.  Will recheck in 6 months patient is to call should he have any further problems with medication or concerns. - lisinopril-hydrochlorothiazide (ZESTORETIC) 10-12.5 MG tablet; Take 1 tablet by mouth daily.  Dispense: 90 tablet; Refill: 1 Problem List Items Addressed This Visit    None        Dr. Otilio Miu [facility]  02/13/19

## 2019-02-13 NOTE — Patient Instructions (Signed)

## 2019-07-25 ENCOUNTER — Other Ambulatory Visit: Payer: Self-pay | Admitting: Family Medicine

## 2019-07-25 DIAGNOSIS — I10 Essential (primary) hypertension: Secondary | ICD-10-CM

## 2019-08-20 ENCOUNTER — Other Ambulatory Visit: Payer: Self-pay | Admitting: Family Medicine

## 2019-08-20 DIAGNOSIS — I10 Essential (primary) hypertension: Secondary | ICD-10-CM

## 2019-08-23 ENCOUNTER — Ambulatory Visit (INDEPENDENT_AMBULATORY_CARE_PROVIDER_SITE_OTHER): Payer: Managed Care, Other (non HMO) | Admitting: Family Medicine

## 2019-08-23 ENCOUNTER — Other Ambulatory Visit: Payer: Self-pay

## 2019-08-23 ENCOUNTER — Encounter: Payer: Self-pay | Admitting: Family Medicine

## 2019-08-23 VITALS — BP 122/88 | HR 64 | Ht 68.0 in | Wt 211.0 lb

## 2019-08-23 DIAGNOSIS — E782 Mixed hyperlipidemia: Secondary | ICD-10-CM | POA: Diagnosis not present

## 2019-08-23 DIAGNOSIS — I10 Essential (primary) hypertension: Secondary | ICD-10-CM | POA: Diagnosis not present

## 2019-08-23 MED ORDER — LOSARTAN POTASSIUM-HCTZ 50-12.5 MG PO TABS: 1.0000 | ORAL_TABLET | Freq: Every day | ORAL | 1 refills | Status: DC

## 2019-08-23 NOTE — Patient Instructions (Signed)

## 2019-08-23 NOTE — Progress Notes (Signed)
Date:  08/23/2019   Name:  Dennis Bryant   DOB:  [birth year]   MRN:  [id number]   Chief Complaint: Hypertension (med refill)  Hypertension This is a chronic problem. The current episode started more than 1 year ago. The problem has been gradually improving since onset. The problem is controlled. Pertinent negatives include no anxiety, blurred vision, chest pain, headaches, malaise/fatigue, neck pain, orthopnea, palpitations, peripheral edema, PND, shortness of breath or sweats. There are no associated agents to hypertension. Past treatments include ACE inhibitors and diuretics. The current treatment provides moderate improvement. There are no compliance problems.  There is no history of angina, kidney disease, CAD/MI, CVA, heart failure, left ventricular hypertrophy, PVD or retinopathy. There is no history of chronic renal disease, a hypertension causing med or renovascular disease.  Hyperlipidemia This is a chronic problem. The current episode started more than 1 year ago. The problem is controlled. Recent lipid tests were reviewed and are normal. He has no history of chronic renal disease, diabetes, hypothyroidism, liver disease, obesity or nephrotic syndrome. Factors aggravating his hyperlipidemia include thiazides. Pertinent negatives include no chest pain, myalgias or shortness of breath. Current antihyperlipidemic treatment includes statins. The current treatment provides moderate improvement of lipids. There are no compliance problems.     Lab Results  Component Value Date   CREATININE 1.13 12/13/2017   BUN 12 12/13/2017   NA 138 12/13/2017   K 4.4 12/13/2017   CL 96 12/13/2017   CO2 26 12/13/2017   Lab Results  Component Value Date   CHOL 268 (H) 07/23/2016   HDL 44 07/23/2016   LDLCALC 200 (H) 07/23/2016   TRIG 119 07/23/2016   CHOLHDL 6.1 (H) 07/23/2016   Lab Results  Component Value Date   TSH 1.830 01/03/2015   No results found for: HGBA1C Lab Results  Component  Value Date   WBC 7.9 07/18/2016   HGB 15.3 07/18/2016   HCT 45.0 07/18/2016   MCV 82.6 07/18/2016   PLT 312 07/18/2016   Lab Results  Component Value Date   ALT 72 (H) 07/18/2016   AST 48 (H) 07/18/2016   ALKPHOS 89 07/18/2016   BILITOT 1.0 07/18/2016     Review of Systems  Constitutional: Negative for chills, fever and malaise/fatigue.  HENT: Negative for drooling, ear discharge, ear pain and sore throat.   Eyes: Negative for blurred vision.  Respiratory: Negative for cough, shortness of breath and wheezing.   Cardiovascular: Negative for chest pain, palpitations, orthopnea, leg swelling and PND.  Gastrointestinal: Negative for abdominal pain, blood in stool, constipation, diarrhea and nausea.  Endocrine: Negative for polydipsia.  Genitourinary: Negative for dysuria, frequency, hematuria and urgency.  Musculoskeletal: Negative for back pain, myalgias and neck pain.  Skin: Negative for rash.  Allergic/Immunologic: Negative for environmental allergies.  Neurological: Negative for dizziness and headaches.  Hematological: Does not bruise/bleed easily.  Psychiatric/Behavioral: Negative for suicidal ideas. The patient is not nervous/anxious.     Patient Active Problem List   Diagnosis Date Noted  . History of transient ischemic attack (TIA) 07/21/2016  . Hyperlipidemia 01/21/2015  . Vitamin D deficiency 01/21/2015  . Obesity (BMI 30.0-34.9) 01/03/2015  . Essential hypertension 01/03/2015  . Headache 01/03/2015  . Erectile dysfunction 01/03/2015  . FH: COPD (chronic obstructive pulmonary disease) 01/03/2015    No Known Allergies  Past Surgical History:  Procedure Laterality Date  . LIPOMA EXCISION N/A 02/22/2017   Procedure: EXCISION OF 2 BACK LIPOMAS  AND 1 ABDOMEN LIPOMA;  Surgeon:  Erroll Luna, MD;  Location: [facility];  Service: General;  Laterality: N/A;    Social History   Tobacco Use  . Smoking status: Never Smoker  . Smokeless tobacco:  Never Used  Substance Use Topics  . Alcohol use: No    Alcohol/week: 0.0 standard drinks  . Drug use: No     Medication list has been reviewed and updated.  Current Meds  Medication Sig  . aspirin EC 81 MG tablet Take 81 mg by mouth daily.  . cholecalciferol (VITAMIN D3) 25 MCG (1000 UNIT) tablet Take 1,000 Units by mouth daily.  Marland Kitchen lisinopril-hydrochlorothiazide (ZESTORETIC) 10-12.5 MG tablet TAKE 1 TABLET BY MOUTH EVERY DAY  . Multiple Vitamins-Minerals (MULTIVITAMIN) tablet Take 1 tablet by mouth daily.    PHQ 2/9 Scores 08/23/2019 02/13/2019 01/01/2019 01/14/2017  PHQ - 2 Score 0 0 0 0  PHQ- 9 Score 4 0 1 1    BP Readings from Last 3 Encounters:  08/23/19 122/88  02/13/19 122/80  01/01/19 110/70    Physical Exam Vitals and nursing note reviewed.  HENT:     Head: Normocephalic.     Right Ear: Tympanic membrane, ear canal and external ear normal.     Left Ear: Tympanic membrane, ear canal and external ear normal.     Nose: Nose normal. No congestion or rhinorrhea.  Eyes:     General: No scleral icterus.       Right eye: No discharge.        Left eye: No discharge.     Conjunctiva/sclera: Conjunctivae normal.     Pupils: Pupils are equal, round, and reactive to light.  Neck:     Thyroid: No thyromegaly.     Vascular: No JVD.     Trachea: No tracheal deviation.  Cardiovascular:     Rate and Rhythm: Normal rate and regular rhythm.     Heart sounds: Normal heart sounds. No murmur. No friction rub. No gallop.   Pulmonary:     Effort: No respiratory distress.     Breath sounds: Normal breath sounds. No wheezing, rhonchi or rales.  Abdominal:     General: Bowel sounds are normal.     Palpations: Abdomen is soft. There is no mass.     Tenderness: There is no abdominal tenderness. There is no guarding or rebound.  Musculoskeletal:        General: No swelling, tenderness, deformity or signs of injury. Normal range of motion.     Cervical back: Normal range of motion and  neck supple.     Right lower leg: No edema.     Left lower leg: No edema.  Lymphadenopathy:     Cervical: No cervical adenopathy.  Skin:    General: Skin is warm.     Capillary Refill: Capillary refill takes less than 2 seconds.     Findings: No rash.  Neurological:     Mental Status: He is alert and oriented to person, place, and time.     Cranial Nerves: No cranial nerve deficit.     Deep Tendon Reflexes: Reflexes are normal and symmetric.  Psychiatric:        Mood and Affect: Mood normal.     Wt Readings from Last 3 Encounters:  08/23/19 211 lb (95.7 kg)  02/13/19 212 lb (96.2 kg)  01/01/19 214 lb (97.1 kg)    BP 122/88   Pulse 64   Ht 5\' 8"  (1.727 m)   Wt 211 lb (95.7 kg)  BMI 32.08 kg/m   Assessment and Plan:  1. Essential hypertension Chronic.  Controlled.  Stable.  However patient has been having a tickling cough on the current dosing of lisinopril hydrochlorothiazide.  This is likely secondary to the ACE inhibitor and we will switch over to an ACE receptor blocker which will be losartan hydrochlorothiazide 50-12 0.51 a day.  We will check a renal function panel to assess glucose 2-hour PC because patient has eaten a breakfast biscuit to make sure that he has not having any underlying diabetes. - Renal Function Panel - losartan-hydrochlorothiazide (HYZAAR) 50-12.5 MG tablet; Take 1 tablet by mouth daily.  Dispense: 90 tablet; Refill: 1  2. Mixed hyperlipidemia Chronic.  Uncontrolled.  Relatively stable.  Patient has been given a low-cholesterol diet and we will check a lipid panel and this will be 2 hours PC as well. - Lipid Panel With LDL/HDL Ratio

## 2019-08-24 LAB — RENAL FUNCTION PANEL
Albumin: 4.5 g/dL (ref 4.0–5.0)
BUN/Creatinine Ratio: 14 (ref 9–20)
BUN: 16 mg/dL (ref 6–24)
CO2: 26 mmol/L (ref 20–29)
Calcium: 10 mg/dL (ref 8.7–10.2)
Chloride: 99 mmol/L (ref 96–106)
Creatinine, Ser: 1.15 mg/dL (ref 0.76–1.27)
GFR calc Af Amer: 89 mL/min/{1.73_m2} (ref >59)
GFR calc non Af Amer: 77 mL/min/{1.73_m2} (ref >59)
Glucose: 89 mg/dL (ref 65–99)
Phosphorus: 3.6 mg/dL (ref 2.8–4.1)
Potassium: 4.6 mmol/L (ref 3.5–5.2)
Sodium: 139 mmol/L (ref 134–144)

## 2019-08-24 LAB — LIPID PANEL WITH LDL/HDL RATIO
Cholesterol, Total: 190 mg/dL (ref 100–199)
HDL: 44 mg/dL (ref >39)
LDL Chol Calc (NIH): 126 mg/dL — ABNORMAL HIGH (ref 0–99)
LDL/HDL Ratio: 2.9 ratio (ref 0.0–3.6)
Triglycerides: 109 mg/dL (ref 0–149)
VLDL Cholesterol Cal: 20 mg/dL (ref 5–40)

## 2019-08-30 ENCOUNTER — Other Ambulatory Visit: Payer: Self-pay | Admitting: Family Medicine

## 2019-08-30 DIAGNOSIS — I10 Essential (primary) hypertension: Secondary | ICD-10-CM

## 2019-09-21 ENCOUNTER — Ambulatory Visit: Payer: Managed Care, Other (non HMO) | Admitting: Family Medicine

## 2019-11-23 ENCOUNTER — Other Ambulatory Visit: Payer: Self-pay

## 2019-11-23 DIAGNOSIS — I10 Essential (primary) hypertension: Secondary | ICD-10-CM

## 2019-12-10 ENCOUNTER — Other Ambulatory Visit: Payer: Self-pay | Admitting: Family Medicine

## 2019-12-10 DIAGNOSIS — I10 Essential (primary) hypertension: Secondary | ICD-10-CM

## 2019-12-10 MED ORDER — LOSARTAN POTASSIUM-HCTZ 50-12.5 MG PO TABS: 1.0000 | ORAL_TABLET | Freq: Every day | ORAL | 0 refills | Status: DC

## 2019-12-10 NOTE — Telephone Encounter (Signed)
[facility] called and spoke to [place], [facility] about the refill requested. He says the patient had the refill transferred to another pharmacy in July, 2021.

## 2019-12-10 NOTE — Telephone Encounter (Signed)
Medication Refill - Medication: losartan-hydrochlorothiazide (HYZAAR) 50-12.5 MG tablet [[id number]]   Preferred Pharmacy (with phone number or street name):  [id number] - [place], [address]  [address] [place] [address]  Phone: [phone] Fax: [phone]     Agent: Please be advised that RX refills may take up to 3 business days. We ask that you follow-up with your pharmacy.

## 2020-03-02 ENCOUNTER — Other Ambulatory Visit: Payer: Self-pay | Admitting: Family Medicine

## 2020-03-02 DIAGNOSIS — I10 Essential (primary) hypertension: Secondary | ICD-10-CM

## 2020-03-02 NOTE — Telephone Encounter (Signed)
Requested Prescriptions  Pending Prescriptions Disp Refills   losartan-hydrochlorothiazide (HYZAAR) 50-12.5 MG tablet [Pharmacy Med Name: LOSARTAN-HCTZ 50-12.5 MG TAB] 30 tablet 0    Sig: TAKE 1 TABLET BY MOUTH EVERY DAY     Cardiovascular: ARB + Diuretic Combos Failed - 03/02/2020 12:42 AM      Failed - K in normal range and within 180 days    Potassium  Date Value Ref Range Status  08/23/2019 4.6 3.5 - 5.2 mmol/L Final         Failed - Na in normal range and within 180 days    Sodium  Date Value Ref Range Status  08/23/2019 139 134 - 144 mmol/L Final         Failed - Cr in normal range and within 180 days    Creatinine, Ser  Date Value Ref Range Status  08/23/2019 1.15 0.76 - 1.27 mg/dL Final         Failed - Ca in normal range and within 180 days    Calcium  Date Value Ref Range Status  08/23/2019 10.0 8.7 - 10.2 mg/dL Final         Failed - Valid encounter within last 6 months    Recent Outpatient Visits          6 months ago Essential hypertension   [place], Deanna C, MD   1 year ago Essential hypertension   [facility] Juline Patch, MD   1 year ago Essential hypertension   [facility] Juline Patch, MD   2 years ago Essential hypertension   [place], Deanna C, MD   3 years ago Essential hypertension   [facility] Juline Patch, MD             Passed - Patient is not pregnant      Passed - Last BP in normal range    BP Readings from Last 1 Encounters:  08/23/19 122/88

## 2020-03-16 ENCOUNTER — Other Ambulatory Visit: Payer: Self-pay | Admitting: Family Medicine

## 2020-03-16 DIAGNOSIS — I10 Essential (primary) hypertension: Secondary | ICD-10-CM

## 2020-05-01 ENCOUNTER — Other Ambulatory Visit: Payer: Self-pay

## 2020-05-01 ENCOUNTER — Emergency Department (HOSPITAL_BASED_OUTPATIENT_CLINIC_OR_DEPARTMENT_OTHER)
Admission: EM | Admit: 2020-05-01 | Discharge: 2020-05-02 | Disposition: A | Discharge status: Home / Self Care | Payer: Managed Care, Other (non HMO) | Attending: Emergency Medicine | Admitting: Emergency Medicine

## 2020-05-01 ENCOUNTER — Encounter (HOSPITAL_BASED_OUTPATIENT_CLINIC_OR_DEPARTMENT_OTHER): Payer: Self-pay

## 2020-05-01 DIAGNOSIS — Z79899 Other long term (current) drug therapy: Secondary | ICD-10-CM | POA: Diagnosis not present

## 2020-05-01 DIAGNOSIS — Z8673 Personal history of transient ischemic attack (TIA), and cerebral infarction without residual deficits: Secondary | ICD-10-CM | POA: Insufficient documentation

## 2020-05-01 DIAGNOSIS — R1013 Epigastric pain: Secondary | ICD-10-CM | POA: Diagnosis not present

## 2020-05-01 DIAGNOSIS — R748 Abnormal levels of other serum enzymes: Secondary | ICD-10-CM | POA: Diagnosis not present

## 2020-05-01 DIAGNOSIS — R072 Precordial pain: Secondary | ICD-10-CM | POA: Insufficient documentation

## 2020-05-01 DIAGNOSIS — Z7982 Long term (current) use of aspirin: Secondary | ICD-10-CM | POA: Diagnosis not present

## 2020-05-01 DIAGNOSIS — R112 Nausea with vomiting, unspecified: Secondary | ICD-10-CM | POA: Diagnosis not present

## 2020-05-01 DIAGNOSIS — I10 Essential (primary) hypertension: Secondary | ICD-10-CM | POA: Diagnosis not present

## 2020-05-01 DIAGNOSIS — R17 Unspecified jaundice: Secondary | ICD-10-CM

## 2020-05-01 LAB — CBC
HCT: 53.7 % — ABNORMAL HIGH (ref 39.0–52.0)
Hemoglobin: 18.5 g/dL — ABNORMAL HIGH (ref 13.0–17.0)
MCH: 28.8 pg (ref 26.0–34.0)
MCHC: 34.5 g/dL (ref 30.0–36.0)
MCV: 83.5 fL (ref 80.0–100.0)
Platelets: 356 10*3/uL (ref 150–400)
RBC: 6.43 MIL/uL — ABNORMAL HIGH (ref 4.22–5.81)
RDW: 12.6 % (ref 11.5–15.5)
WBC: 17.9 10*3/uL — ABNORMAL HIGH (ref 4.0–10.5)
nRBC: 0 % (ref 0.0–0.2)

## 2020-05-01 LAB — HEPATIC FUNCTION PANEL
ALT: 35 U/L (ref 0–44)
AST: 25 U/L (ref 15–41)
Albumin: 4.3 g/dL (ref 3.5–5.0)
Alkaline Phosphatase: 73 U/L (ref 38–126)
Bilirubin, Direct: 0.2 mg/dL (ref 0.0–0.2)
Indirect Bilirubin: 1.3 mg/dL — ABNORMAL HIGH (ref 0.3–0.9)
Total Bilirubin: 1.5 mg/dL — ABNORMAL HIGH (ref 0.3–1.2)
Total Protein: 8.2 g/dL — ABNORMAL HIGH (ref 6.5–8.1)

## 2020-05-01 LAB — BASIC METABOLIC PANEL
Anion gap: 14 (ref 5–15)
BUN: 16 mg/dL (ref 6–20)
CO2: 24 mmol/L (ref 22–32)
Calcium: 9.8 mg/dL (ref 8.9–10.3)
Chloride: 98 mmol/L (ref 98–111)
Creatinine, Ser: 1.03 mg/dL (ref 0.61–1.24)
GFR, Estimated: >60 mL/min (ref >60)
Glucose, Bld: 124 mg/dL — ABNORMAL HIGH (ref 70–99)
Potassium: 3.8 mmol/L (ref 3.5–5.1)
Sodium: 136 mmol/L (ref 135–145)

## 2020-05-01 LAB — TROPONIN I (HIGH SENSITIVITY): Troponin I (High Sensitivity): 4 ng/L (ref <18)

## 2020-05-01 LAB — LIPASE, BLOOD: Lipase: 88 U/L — ABNORMAL HIGH (ref 11–51)

## 2020-05-01 MED ORDER — LOSARTAN POTASSIUM-HCTZ 50-12.5 MG PO TABS: 1.0000 | ORAL_TABLET | Freq: Every day | ORAL | 0 refills | Status: AC

## 2020-05-01 MED ORDER — ONDANSETRON HCL 4 MG/2ML IJ SOLN
4.0000 mg | Freq: Once | INTRAMUSCULAR | Status: AC
  Administered 2020-05-01: 4 mg via INTRAVENOUS
  Filled 2020-05-01: qty 2

## 2020-05-01 MED ORDER — LACTATED RINGERS IV BOLUS
1000.0000 mL | Freq: Once | INTRAVENOUS | Status: AC
  Administered 2020-05-01: 1000 mL via INTRAVENOUS

## 2020-05-01 NOTE — ED Triage Notes (Signed)
Pt reports he has not been taking his normal BP medications r/t having difficulty getting a doctor.

## 2020-05-01 NOTE — ED Triage Notes (Signed)
Pt arrives with reports of vomiting, states that he has been covid and flu tested, all were negative states that he went for a hike yesterday and has been having central chest pain since with increase in vomiting. Pt also c/o dizziness and lightheaded.

## 2020-05-01 NOTE — ED Notes (Signed)
Unable to locate pt. He was asleep.

## 2020-05-01 NOTE — ED Provider Notes (Signed)
[facility] EMERGENCY DEPARTMENT Provider Note   CSN: [id number] Arrival date & time: 05/01/20  1854   History Chief Complaint  Patient presents with  . Emesis    Dennis Bryant is a 44 y.o. male.  The history is provided by the patient.  Emesis He has history of hypertension, hyperlipidemia and comes in because of chest pain since yesterday.  Was hiking up a steep hill and encountered half mile when he developed pain in the lower sternal area and epigastric area.  Pain is a dull, achy feeling and has been constant since then.  It does not radiate.  There has been associated nausea and vomiting has not been able to hold any fluids down since then.  He denies any diarrhea.  He denies any dyspnea, fever, chills but has had some sweats.  He has tried taking antacids without relief.  He is a non-smoker.   Past Medical History:  Diagnosis Date  . Hypertension   . Lipoma    back and abdomen  . Vitamin D deficiency     Patient Active Problem List   Diagnosis Date Noted  . History of transient ischemic attack (TIA) 07/21/2016  . Hyperlipidemia 01/21/2015  . Vitamin D deficiency 01/21/2015  . Obesity (BMI 30.0-34.9) 01/03/2015  . Essential hypertension 01/03/2015  . Headache 01/03/2015  . Erectile dysfunction 01/03/2015  . FH: COPD (chronic obstructive pulmonary disease) 01/03/2015    Past Surgical History:  Procedure Laterality Date  . LIPOMA EXCISION N/A 02/22/2017   Procedure: EXCISION OF 2 BACK LIPOMAS  AND 1 ABDOMEN LIPOMA;  Surgeon: Erroll Luna, MD;  Location: [facility];  Service: General;  Laterality: N/A;       Family History  Problem Relation Age of Onset  . Cancer Maternal Uncle   . Cancer Maternal Grandmother     Social History   Tobacco Use  . Smoking status: Never Smoker  . Smokeless tobacco: Never Used  Substance Use Topics  . Alcohol use: No    Alcohol/week: 0.0 standard drinks  . Drug use: No    Home  Medications Prior to Admission medications   Medication Sig Start Date End Date Taking? Authorizing Provider  aspirin EC 81 MG tablet Take 81 mg by mouth daily.    [provider]  cholecalciferol (VITAMIN D3) 25 MCG (1000 UNIT) tablet Take 1,000 Units by mouth daily.    [provider]  lisinopril (ZESTRIL) 20 MG tablet Take 20 mg by mouth daily.    [provider]  losartan-hydrochlorothiazide (HYZAAR) 50-12.5 MG tablet TAKE 1 TABLET BY MOUTH EVERY DAY 03/02/20   Juline Patch, MD  Multiple Vitamins-Minerals (MULTIVITAMIN) tablet Take 1 tablet by mouth daily. 01/03/15   Adline Potter, MD    Allergies    Patient has no known allergies.  Review of Systems   Review of Systems  Gastrointestinal: Positive for vomiting.  All other systems reviewed and are negative.   Physical Exam Updated Vital Signs BP (!) 133/93   Pulse 98   Temp 98.6 F (37 C) (Oral)   Resp 16   Ht 5\' 8"  (1.727 m)   Wt 95.7 kg   SpO2 97%   BMI 32.08 kg/m   Physical Exam Vitals and nursing note reviewed.   44 year old male, resting comfortably and in no acute distress. Vital signs are significant for borderline elevated blood pressure. Oxygen saturation is 97%, which is normal. Head is normocephalic and atraumatic. PERRLA, EOMI. Oropharynx is clear.  Neck is nontender and supple without adenopathy or JVD. Back is nontender and there is no CVA tenderness. Lungs are clear without rales, wheezes, or rhonchi. Chest is nontender. Heart has regular rate and rhythm without murmur. Abdomen is soft, flat, with mild epigastric tenderness.  There is no rebound or guarding.  There are no masses or hepatosplenomegaly and peristalsis is hypoactive. Extremities have no cyanosis or edema, full range of motion is present. Skin is warm and dry without rash. Neurologic: Mental status is normal, cranial nerves are intact, there are no motor or sensory deficits.  ED Results / Procedures /  Treatments   Labs (all labs ordered are listed, but only abnormal results are displayed) Labs Reviewed  BASIC METABOLIC PANEL - Abnormal; Notable for the following components:      Result Value   Glucose, Bld 124 (*)    All other components within normal limits  CBC - Abnormal; Notable for the following components:   WBC 17.9 (*)    RBC 6.43 (*)    Hemoglobin 18.5 (*)    HCT 53.7 (*)    All other components within normal limits  TROPONIN I (HIGH SENSITIVITY)  TROPONIN I (HIGH SENSITIVITY)    EKG EKG Interpretation  Date/Time:  Thursday May 01 2020 19:02:23 EST Ventricular Rate:  63 PR Interval:  162 QRS Duration: 90 QT Interval:  384 QTC Calculation: 392 R Axis:   -16 Text Interpretation: Normal sinus rhythm Normal ECG When compared with ECG of 07/18/2016, No significant change was found Confirmed by Delora Fuel ([id number]) on 05/01/2020 10:52:04 PM  Procedures Procedures   Medications Ordered in ED Medications  ondansetron (ZOFRAN) injection 4 mg (has no administration in time range)  lactated ringers bolus 1,000 mL (has no administration in time range)    ED Course  I have reviewed the triage vital signs and the nursing notes.  Pertinent labs & imaging results that were available during my care of the patient were reviewed by me and considered in my medical decision making (see chart for details).  MDM Rules/Calculators/A&P Nausea, vomiting, epigastric pain, doubt cardiac etiology given constant nature of pain.  Seems to be less likely viral gastroenteritis.  Doubt pancreatitis, diverticulitis, bowel obstruction.  ECG is normal.  Initial labs show polycythemia and elevated WBC.  Patient is nontoxic in appearance and the exam is benign, I believe his elevated WBC is purely a stress reaction.  Positive pain is probably secondary to dehydration although BUN and creatinine are normal.  Initial troponin is normal, repeat pending.  We will also check hepatic function panel,  lipase and give IV fluids and ondansetron.  Old records are reviewed showing outpatient management of hypertension.  Of note, he states that he has not been able to get into see his PCP and PCP is not been willing to renew his prescription for losartan-hydrochlorothiazide.  He has leftover lisinopril-hydrochlorothiazide which she had been prescribed for him prior to the prescription having been changed.  He is given a new prescription for losartan-hydrochlorothiazide.  12:10 AM He vomited after getting ondansetron, is given prochlorperazine.  Following prochlorperazine, he has had no further vomiting.  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  Also, lipase is mildly elevated, not felt to be clinically significant.  Repeat troponin is normal.  He is discharged with prescription for prochlorperazine.  Final Clinical Impression(s) / ED Diagnoses Final diagnoses:  Non-intractable vomiting with nausea, unspecified vomiting type  Serum total bilirubin elevated  Elevated  lipase    Rx / DC Orders ED Discharge Orders         Ordered    prochlorperazine (COMPAZINE) 10 MG tablet  Every 6 hours PRN        05/02/20 0140    losartan-hydrochlorothiazide (HYZAAR) 50-12.5 MG tablet  Daily       Note to Pharmacy: 30 day courtesy RF- please make appt for further RF   05/01/20 [id number]           Delora Fuel, MD [id number]

## 2020-05-02 LAB — TROPONIN I (HIGH SENSITIVITY): Troponin I (High Sensitivity): 5 ng/L (ref <18)

## 2020-05-02 MED ORDER — PROCHLORPERAZINE MALEATE 10 MG PO TABS: 10.0000 mg | ORAL_TABLET | Freq: Four times a day (QID) | ORAL | 0 refills | Status: AC | PRN

## 2020-05-02 MED ORDER — PROCHLORPERAZINE EDISYLATE 10 MG/2ML IJ SOLN
10.0000 mg | Freq: Once | INTRAMUSCULAR | Status: AC
  Administered 2020-05-02: 10 mg via INTRAVENOUS
  Filled 2020-05-02: qty 2

## 2020-05-02 NOTE — Discharge Instructions (Signed)
Return if you are having any problems.
